# Patient Record
Sex: Male | Born: 1985 | Race: White | Hispanic: No | Marital: Single | Smoking: Current every day smoker
Health system: Southern US, Community
[De-identification: ages and names within clinical notes are randomized; demographics above are authoritative.]

## PROBLEM LIST (undated history)

## (undated) DIAGNOSIS — Z87442 Personal history of urinary calculi: Secondary | ICD-10-CM

## (undated) DIAGNOSIS — F32A Depression, unspecified: Secondary | ICD-10-CM

## (undated) DIAGNOSIS — F319 Bipolar disorder, unspecified: Secondary | ICD-10-CM

## (undated) DIAGNOSIS — E079 Disorder of thyroid, unspecified: Secondary | ICD-10-CM

## (undated) DIAGNOSIS — R011 Cardiac murmur, unspecified: Secondary | ICD-10-CM

## (undated) DIAGNOSIS — R3915 Urgency of urination: Secondary | ICD-10-CM

## (undated) HISTORY — PX: WISDOM TOOTH EXTRACTION: SHX21

---
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
## 2006-11-03 ENCOUNTER — Emergency Department: Payer: Self-pay | Admitting: Unknown Physician Specialty

## 2006-11-03 ENCOUNTER — Other Ambulatory Visit: Payer: Self-pay

## 2006-11-04 ENCOUNTER — Emergency Department: Payer: Self-pay | Admitting: Emergency Medicine

## 2007-02-04 ENCOUNTER — Other Ambulatory Visit: Payer: Self-pay

## 2007-02-04 ENCOUNTER — Emergency Department: Payer: Self-pay | Admitting: Emergency Medicine

## 2009-09-28 ENCOUNTER — Emergency Department: Payer: Self-pay | Admitting: Emergency Medicine

## 2009-10-17 ENCOUNTER — Emergency Department: Payer: Self-pay | Admitting: Emergency Medicine

## 2009-10-29 ENCOUNTER — Emergency Department: Payer: Self-pay | Admitting: Emergency Medicine

## 2009-12-13 ENCOUNTER — Emergency Department: Payer: Self-pay | Admitting: Emergency Medicine

## 2010-01-22 ENCOUNTER — Emergency Department: Payer: Self-pay | Admitting: Emergency Medicine

## 2010-01-31 ENCOUNTER — Emergency Department: Payer: Self-pay | Admitting: Emergency Medicine

## 2010-03-17 ENCOUNTER — Emergency Department: Payer: Self-pay | Admitting: Emergency Medicine

## 2010-06-27 ENCOUNTER — Emergency Department (HOSPITAL_COMMUNITY)
Admission: EM | Admit: 2010-06-27 | Discharge: 2010-06-27 | Disposition: A | Discharge status: Home / Self Care | Payer: Self-pay | Attending: Emergency Medicine | Admitting: Emergency Medicine

## 2010-06-27 DIAGNOSIS — K029 Dental caries, unspecified: Secondary | ICD-10-CM | POA: Insufficient documentation

## 2010-06-27 DIAGNOSIS — K089 Disorder of teeth and supporting structures, unspecified: Secondary | ICD-10-CM | POA: Insufficient documentation

## 2010-06-27 DIAGNOSIS — F319 Bipolar disorder, unspecified: Secondary | ICD-10-CM | POA: Insufficient documentation

## 2010-08-30 ENCOUNTER — Emergency Department: Payer: Self-pay | Admitting: Emergency Medicine

## 2010-11-03 ENCOUNTER — Emergency Department: Payer: Self-pay | Admitting: Emergency Medicine

## 2012-01-02 ENCOUNTER — Emergency Department: Payer: Self-pay | Admitting: Internal Medicine

## 2012-06-11 ENCOUNTER — Emergency Department: Payer: Self-pay | Admitting: Emergency Medicine

## 2012-10-12 ENCOUNTER — Emergency Department: Payer: Self-pay | Admitting: Emergency Medicine

## 2013-05-01 ENCOUNTER — Emergency Department: Payer: Self-pay | Admitting: Emergency Medicine

## 2013-06-05 ENCOUNTER — Emergency Department: Payer: Self-pay | Admitting: Emergency Medicine

## 2013-08-07 ENCOUNTER — Emergency Department: Payer: Self-pay | Admitting: Emergency Medicine

## 2013-08-07 LAB — CBC WITH DIFFERENTIAL/PLATELET
BASOS PCT: 0.5 %
Basophil #: 0 10*3/uL (ref 0.0–0.1)
EOS PCT: 1.1 %
Eosinophil #: 0.1 10*3/uL (ref 0.0–0.7)
HCT: 46 % (ref 40.0–52.0)
HGB: 15.6 g/dL (ref 13.0–18.0)
Lymphocyte #: 0.3 10*3/uL — ABNORMAL LOW (ref 1.0–3.6)
Lymphocyte %: 3.2 %
MCH: 35.1 pg — AB (ref 26.0–34.0)
MCHC: 34 g/dL (ref 32.0–36.0)
MCV: 103 fL — ABNORMAL HIGH (ref 80–100)
MONOS PCT: 11.5 %
Monocyte #: 0.9 x10 3/mm (ref 0.2–1.0)
NEUTROS ABS: 6.7 10*3/uL — AB (ref 1.4–6.5)
Neutrophil %: 83.7 %
Platelet: 224 10*3/uL (ref 150–440)
RBC: 4.46 10*6/uL (ref 4.40–5.90)
RDW: 12.6 % (ref 11.5–14.5)
WBC: 8 10*3/uL (ref 3.8–10.6)

## 2013-08-07 LAB — COMPREHENSIVE METABOLIC PANEL
ALBUMIN: 3.8 g/dL (ref 3.4–5.0)
Alkaline Phosphatase: 95 U/L
Anion Gap: 6 — ABNORMAL LOW (ref 7–16)
BILIRUBIN TOTAL: 0.7 mg/dL (ref 0.2–1.0)
BUN: 9 mg/dL (ref 7–18)
CHLORIDE: 103 mmol/L (ref 98–107)
CREATININE: 0.91 mg/dL (ref 0.60–1.30)
Calcium, Total: 8.8 mg/dL (ref 8.5–10.1)
Co2: 26 mmol/L (ref 21–32)
EGFR (African American): >60
EGFR (Non-African Amer.): >60
GLUCOSE: 102 mg/dL — AB (ref 65–99)
Osmolality: 269 (ref 275–301)
POTASSIUM: 3.6 mmol/L (ref 3.5–5.1)
SGOT(AST): 28 U/L (ref 15–37)
SGPT (ALT): 41 U/L (ref 12–78)
Sodium: 135 mmol/L — ABNORMAL LOW (ref 136–145)
Total Protein: 7.6 g/dL (ref 6.4–8.2)

## 2013-08-07 LAB — URINALYSIS, COMPLETE
BACTERIA: NONE SEEN
BLOOD: NEGATIVE
Bilirubin,UR: NEGATIVE
GLUCOSE, UR: NEGATIVE mg/dL (ref 0–75)
Ketone: NEGATIVE
Leukocyte Esterase: NEGATIVE
Nitrite: NEGATIVE
PH: 7 (ref 4.5–8.0)
Protein: NEGATIVE
RBC,UR: <1 /HPF (ref 0–5)
SPECIFIC GRAVITY: 1.016 (ref 1.003–1.030)
SQUAMOUS EPITHELIAL: NONE SEEN
WBC UR: 2 /HPF (ref 0–5)

## 2013-08-07 LAB — RAPID INFLUENZA A&B ANTIGENS

## 2013-08-07 LAB — LIPASE, BLOOD: Lipase: 71 U/L — ABNORMAL LOW (ref 73–393)

## 2013-08-07 LAB — TROPONIN I

## 2013-08-08 LAB — MONONUCLEOSIS SCREEN: MONO TEST: NEGATIVE

## 2014-10-24 ENCOUNTER — Encounter: Payer: Self-pay | Admitting: Urgent Care

## 2014-10-24 ENCOUNTER — Emergency Department
Admission: EM | Admit: 2014-10-24 | Discharge: 2014-10-24 | Disposition: A | Discharge status: Home / Self Care | Payer: Medicaid Other | Attending: Emergency Medicine | Admitting: Emergency Medicine

## 2014-10-24 DIAGNOSIS — A6920 Lyme disease, unspecified: Secondary | ICD-10-CM | POA: Insufficient documentation

## 2014-10-24 DIAGNOSIS — R109 Unspecified abdominal pain: Secondary | ICD-10-CM | POA: Insufficient documentation

## 2014-10-24 DIAGNOSIS — R51 Headache: Secondary | ICD-10-CM | POA: Diagnosis not present

## 2014-10-24 DIAGNOSIS — W57XXXA Bitten or stung by nonvenomous insect and other nonvenomous arthropods, initial encounter: Secondary | ICD-10-CM | POA: Insufficient documentation

## 2014-10-24 DIAGNOSIS — Y998 Other external cause status: Secondary | ICD-10-CM | POA: Insufficient documentation

## 2014-10-24 DIAGNOSIS — Y9389 Activity, other specified: Secondary | ICD-10-CM | POA: Diagnosis not present

## 2014-10-24 DIAGNOSIS — Z720 Tobacco use: Secondary | ICD-10-CM | POA: Diagnosis not present

## 2014-10-24 DIAGNOSIS — S60562A Insect bite (nonvenomous) of left hand, initial encounter: Secondary | ICD-10-CM | POA: Diagnosis present

## 2014-10-24 DIAGNOSIS — Y9289 Other specified places as the place of occurrence of the external cause: Secondary | ICD-10-CM | POA: Insufficient documentation

## 2014-10-24 HISTORY — DX: Cardiac murmur, unspecified: R01.1

## 2014-10-24 HISTORY — DX: Disorder of thyroid, unspecified: E07.9

## 2014-10-24 MED ORDER — DOXYCYCLINE HYCLATE 100 MG PO TABS: 100.0000 mg | ORAL_TABLET | Freq: Two times a day (BID) | ORAL | Status: DC

## 2014-10-24 MED ORDER — ACETAMINOPHEN 325 MG PO TABS
650.0000 mg | ORAL_TABLET | Freq: Once | ORAL | Status: AC
  Administered 2014-10-24: 650 mg via ORAL

## 2014-10-24 MED ORDER — ACETAMINOPHEN 325 MG PO TABS
ORAL_TABLET | ORAL | Status: AC
  Administered 2014-10-24: 650 mg via ORAL
  Filled 2014-10-24: qty 2

## 2014-10-24 MED ORDER — DOXYCYCLINE HYCLATE 100 MG PO TABS
100.0000 mg | ORAL_TABLET | Freq: Once | ORAL | Status: AC
  Administered 2014-10-24: 100 mg via ORAL

## 2014-10-24 MED ORDER — DOXYCYCLINE HYCLATE 100 MG PO TABS
ORAL_TABLET | ORAL | Status: AC
  Administered 2014-10-24: 100 mg via ORAL
  Filled 2014-10-24: qty 1

## 2014-10-24 NOTE — ED Provider Notes (Signed)
[facility] Emergency Department Provider Note  ____________________________________________  Time seen: 2:30 AM  I have reviewed the triage vital signs and the nursing notes.   HISTORY  Chief Complaint Headache; Abdominal Pain; and Insect Bite      HPI Brian Cole is a 29 y.o. male into generalize myalgia and malaise subjective fevers times one week. Patient stated he had a tick bite on his left hand apparently a week ago with symptoms onset following that event.     Past Medical History  Diagnosis Date  . Murmur   . Thyroid disease     There are no active problems to display for this patient.   History reviewed. No pertinent past surgical history.  Current Outpatient Rx  Name  Route  Sig  Dispense  Refill  . doxycycline (VIBRA-TABS) 100 MG tablet   Oral   Take 1 tablet (100 mg total) by mouth 2 (two) times daily.   28 tablet   0     Allergies Review of patient's allergies indicates no known allergies.  No family history on file.  Social History History  Substance Use Topics  . Smoking status: Current Every Day Smoker  . Smokeless tobacco: Not on file  . Alcohol Use: Yes    Review of Systems  Constitutional: Negative for fever. Eyes: Negative for visual changes. ENT: Negative for sore throat. Cardiovascular: Negative for chest pain. Respiratory: Negative for shortness of breath. Gastrointestinal: Negative for abdominal pain, vomiting and diarrhea. Genitourinary: Negative for dysuria. Musculoskeletal: Negative for back pain. Skin: Negative for rash. Neurological: Negative for headaches, focal weakness or numbness.   10-point ROS otherwise negative.  ____________________________________________   PHYSICAL EXAM:  VITAL SIGNS: ED Triage Vitals  Enc Vitals Group     BP 10/24/14 0053 136/73 mmHg     Pulse Rate 10/24/14 0053 97     Resp 10/24/14 0053 18     Temp 10/24/14 0053 99 F (37.2 C)     Temp Source  10/24/14 0053 Oral     SpO2 10/24/14 0053 97 %     Weight 10/24/14 0053 265 lb (120.203 kg)     Height 10/24/14 0053 5\' 11"  (1.803 m)     Head Cir --      Peak Flow --      Pain Score 10/24/14 0056 5     Pain Loc --      Pain Edu? --      Excl. in GC? --      Constitutional: Alert and oriented. Well appearing and in no distress. Eyes: Conjunctivae are normal. PERRL. Normal extraocular movements. ENT   Head: Normocephalic and atraumatic.   Nose: No congestion/rhinnorhea.   Mouth/Throat: Mucous membranes are moist.   Neck: No stridor. Hematological/Lymphatic/Immunilogical: No cervical lymphadenopathy. Cardiovascular: Normal rate, regular rhythm. Normal and symmetric distal pulses are present in all extremities. No murmurs, rubs, or gallops. Respiratory: Normal respiratory effort without tachypnea nor retractions. Breath sounds are clear and equal bilaterally. No wheezes/rales/rhonchi. Gastrointestinal: Soft and nontender. No distention. There is no CVA tenderness. Genitourinary: deferred Musculoskeletal: Nontender with normal range of motion in all extremities. No joint effusions.  No lower extremity tenderness nor edema. Neurologic:  Normal speech and language. No gross focal neurologic deficits are appreciated. Speech is normal.  Skin:  Skin is warm, dry and intact. No rash noted. Psychiatric: Mood and affect are normal. Speech and behavior are normal. Patient exhibits appropriate insight and judgment.  ____________________________________________  INITIAL IMPRESSION / ASSESSMENT AND PLAN / ED COURSE  Pertinent labs & imaging results that were available during my care of the patient were reviewed by me and considered in my medical decision making (see chart for details).  Physical exam concerning for Lyme disease as such patient given doxycycline 100 mg in the emergency department will be prescribed the same at  home.  ____________________________________________   FINAL CLINICAL IMPRESSION(S) / ED DIAGNOSES  Final diagnoses:  Lyme disease      Darci Current, MD 10/24/14 [phone]

## 2014-10-24 NOTE — ED Notes (Signed)
Spoke with Manson Passey, MD regarding presenting c/o and triage assessment. MD does not want labs at this time; no orders per MD.

## 2014-10-24 NOTE — ED Notes (Signed)
Patient presents with c/o a tick removal over a week ago. Patient advising that he has had a headache, abd pain, and trouble getting bowels to move. Patient states, "I am too weak to push it out."

## 2014-10-24 NOTE — Discharge Instructions (Signed)
Lyme Disease You may have been bitten by a tick and are to watch for the development of Lyme Disease. Lyme Disease is an infection that is caused by a bacteria The bacteria causing this disease is named Borreilia burgdorferi. If a tick is infected with this bacteria and then bites you, then Lyme Disease may occur. These ticks are carried by deer and rodents such as rabbits and mice and infest grassy as well as forested areas. Fortunately most tick bites do not cause Lyme Disease.  Lyme Disease is easier to prevent than to treat. First, covering your legs with clothing when walking in areas where ticks are possibly abundant will prevent their attachment because ticks tend to stay within inches of the ground. Second, using insecticides containing DEET can be applied on skin or clothing. Last, because it takes about 12 to 24 hours for the tick to transmit the disease after attachment to the human host, you should inspect your body for ticks twice a day when you are in areas where Lyme Disease is common. You must look thoroughly when searching for ticks. The Ixodes tick that carries Lyme Disease is very small. It is around the size of a sesame seed (picture of tick is not actual size). Removal is best done by grasping the tick by the head and pulling it out. Do not to squeeze the body of the tick. This could inject the infecting bacteria into the bite site. Wash the area of the bite with an antiseptic solution after removal.  Lyme Disease is a disease that may affect many body systems. Because of the small size of the biting tick, most people do not notice being bitten. The first sign of an infection is usually a round red rash that extends out from the center of the tick bite. The center of the lesion may be blood colored (hemorrhagic) or have tiny blisters (vesicular). Most lesions have bright red outer borders and partial central clearing. This rash may extend out many inches in diameter, and multiple lesions may  be present. Other symptoms such as fatigue, headaches, chills and fever, general achiness and swelling of lymph glands may also occur. If this first stage of the disease is left untreated, these symptoms may gradually resolve by themselves, or progressive symptoms may occur because of spread of infection to other areas of the body.  Follow up with your caregiver to have testing and treatment if you have a tick bite and you develop any of the above complaints. Your caregiver may recommend preventative (prophylactic) medications which kill bacteria (antibiotics). Once a diagnosis of Lyme Disease is made, antibiotic treatment is highly likely to cure the disease. Effective treatment of late stage Lyme Disease may require longer courses of antibiotic therapy.  MAKE SURE YOU:   Understand these instructions.  Will watch your condition.  Will get help right away if you are not doing well or get worse. Document Released: 08/02/2000 Document Revised: 07/19/2011 Document Reviewed: 10/04/2008 Exit[employer] Patient Information 2015 [employer]. This information is not intended to replace advice given to you by your health care provider. Make sure you discuss any questions you have with your health care provider.  

## 2014-12-13 IMAGING — CR DG CHEST 2V
1 series · 2 of 2 positions shown · non-contrast
Comparison: October 29, 2009

CLINICAL DATA: Productive cough

EXAM:
CHEST  2 VIEW

[Series 1: pa · 0.17mm/px · 2 of 2 slices shown]
[im 1/2]
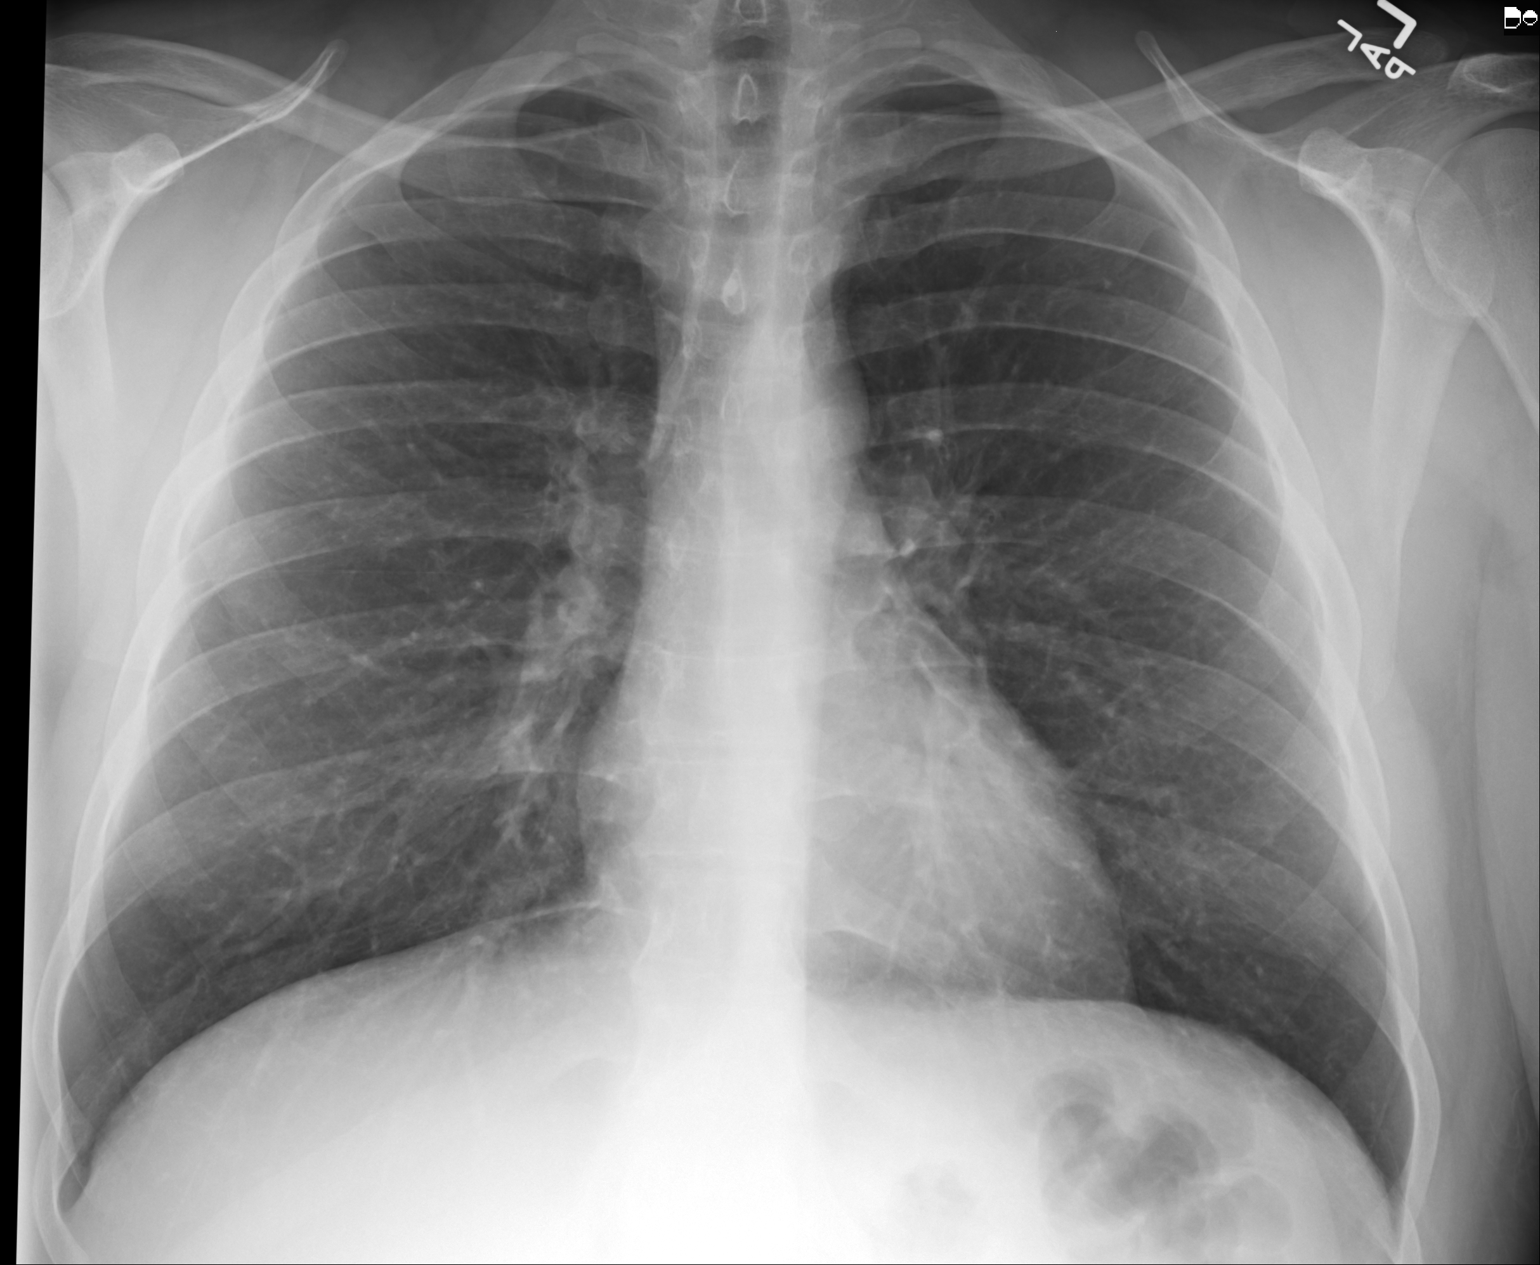
[im 2/2]
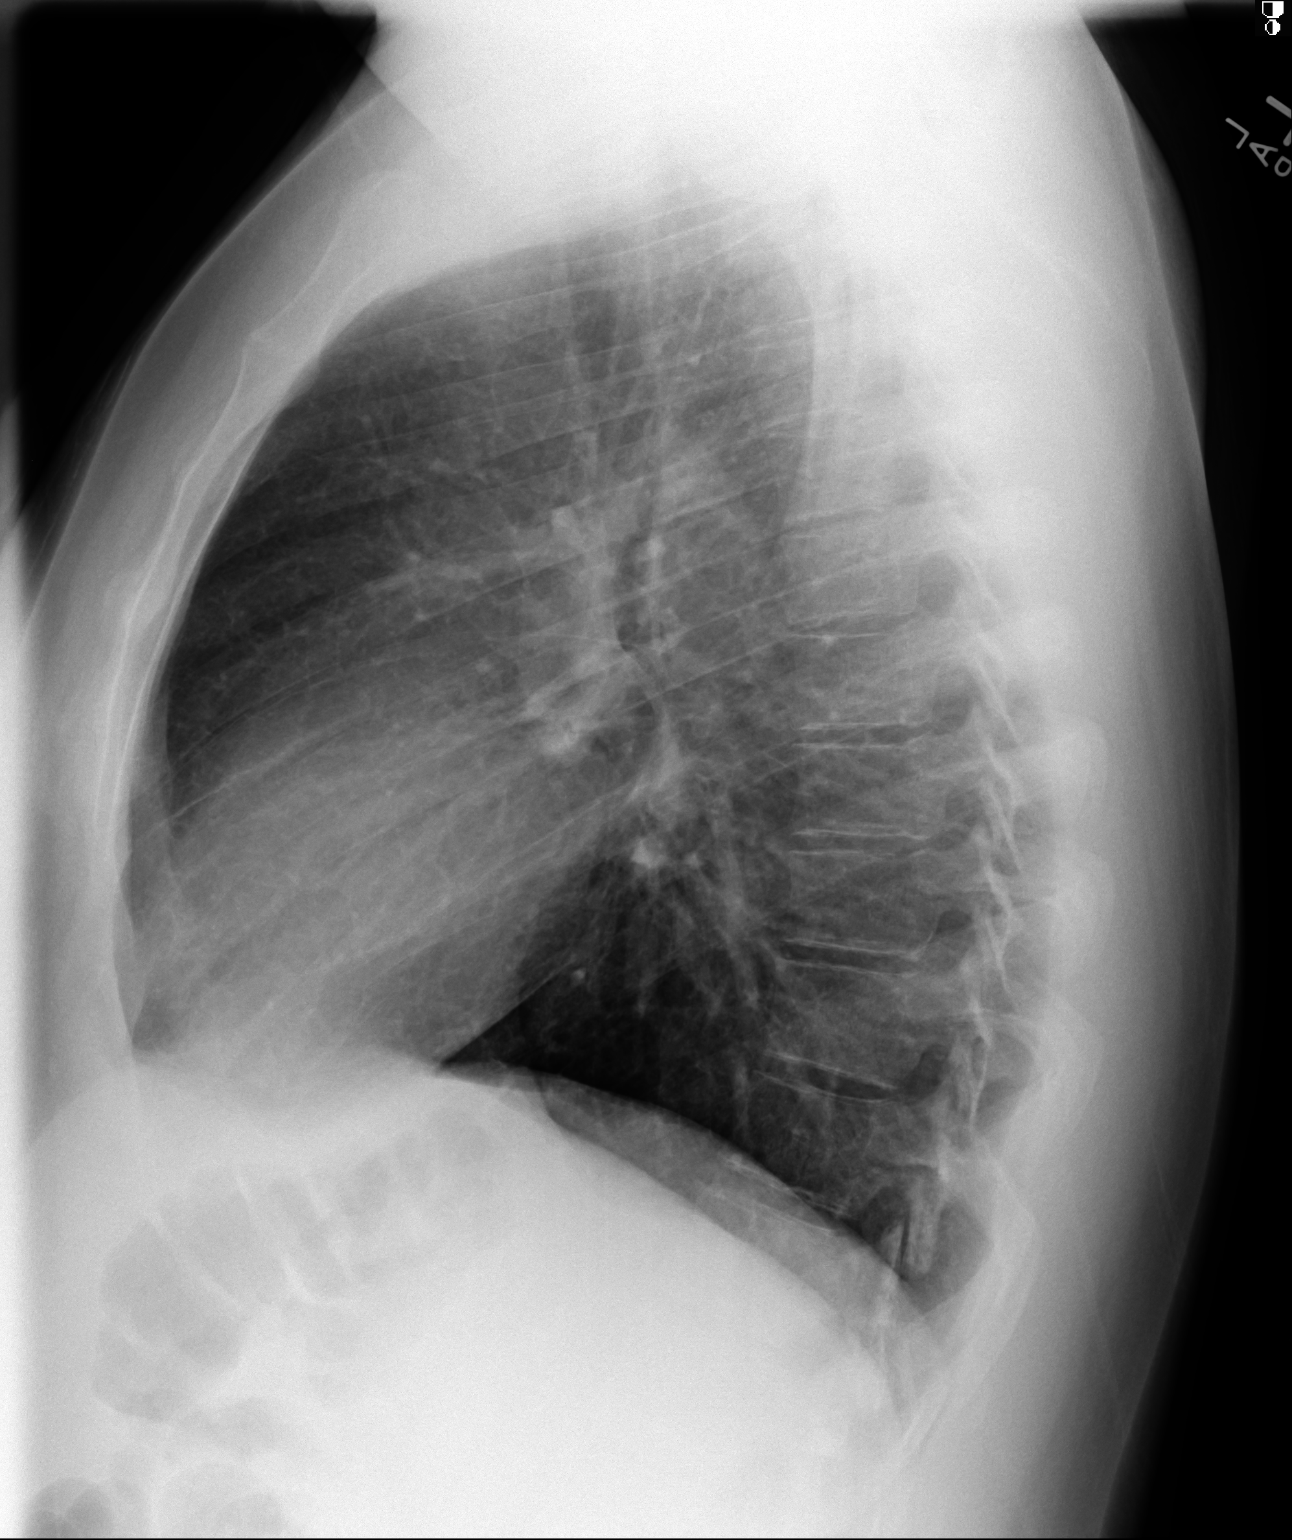

[2 of 2 positions shown; findings below may reference images not displayed]

FINDINGS: The lungs are clear. Heart size and pulmonary vascularity are
normal. No adenopathy. No bone lesions.
IMPRESSION: No abnormality noted.

## 2015-03-21 IMAGING — CR DG CHEST 2V
1 series · 2 of 2 positions shown · non-contrast
Comparison: Prior chest x-ray 06/05/2013

CLINICAL DATA: Fever, body aches, nausea and vomiting

EXAM:
CHEST  2 VIEW

[Series 1: w chest pa · 0.14mm/px · 2 of 2 slices shown]
[im 1/2]
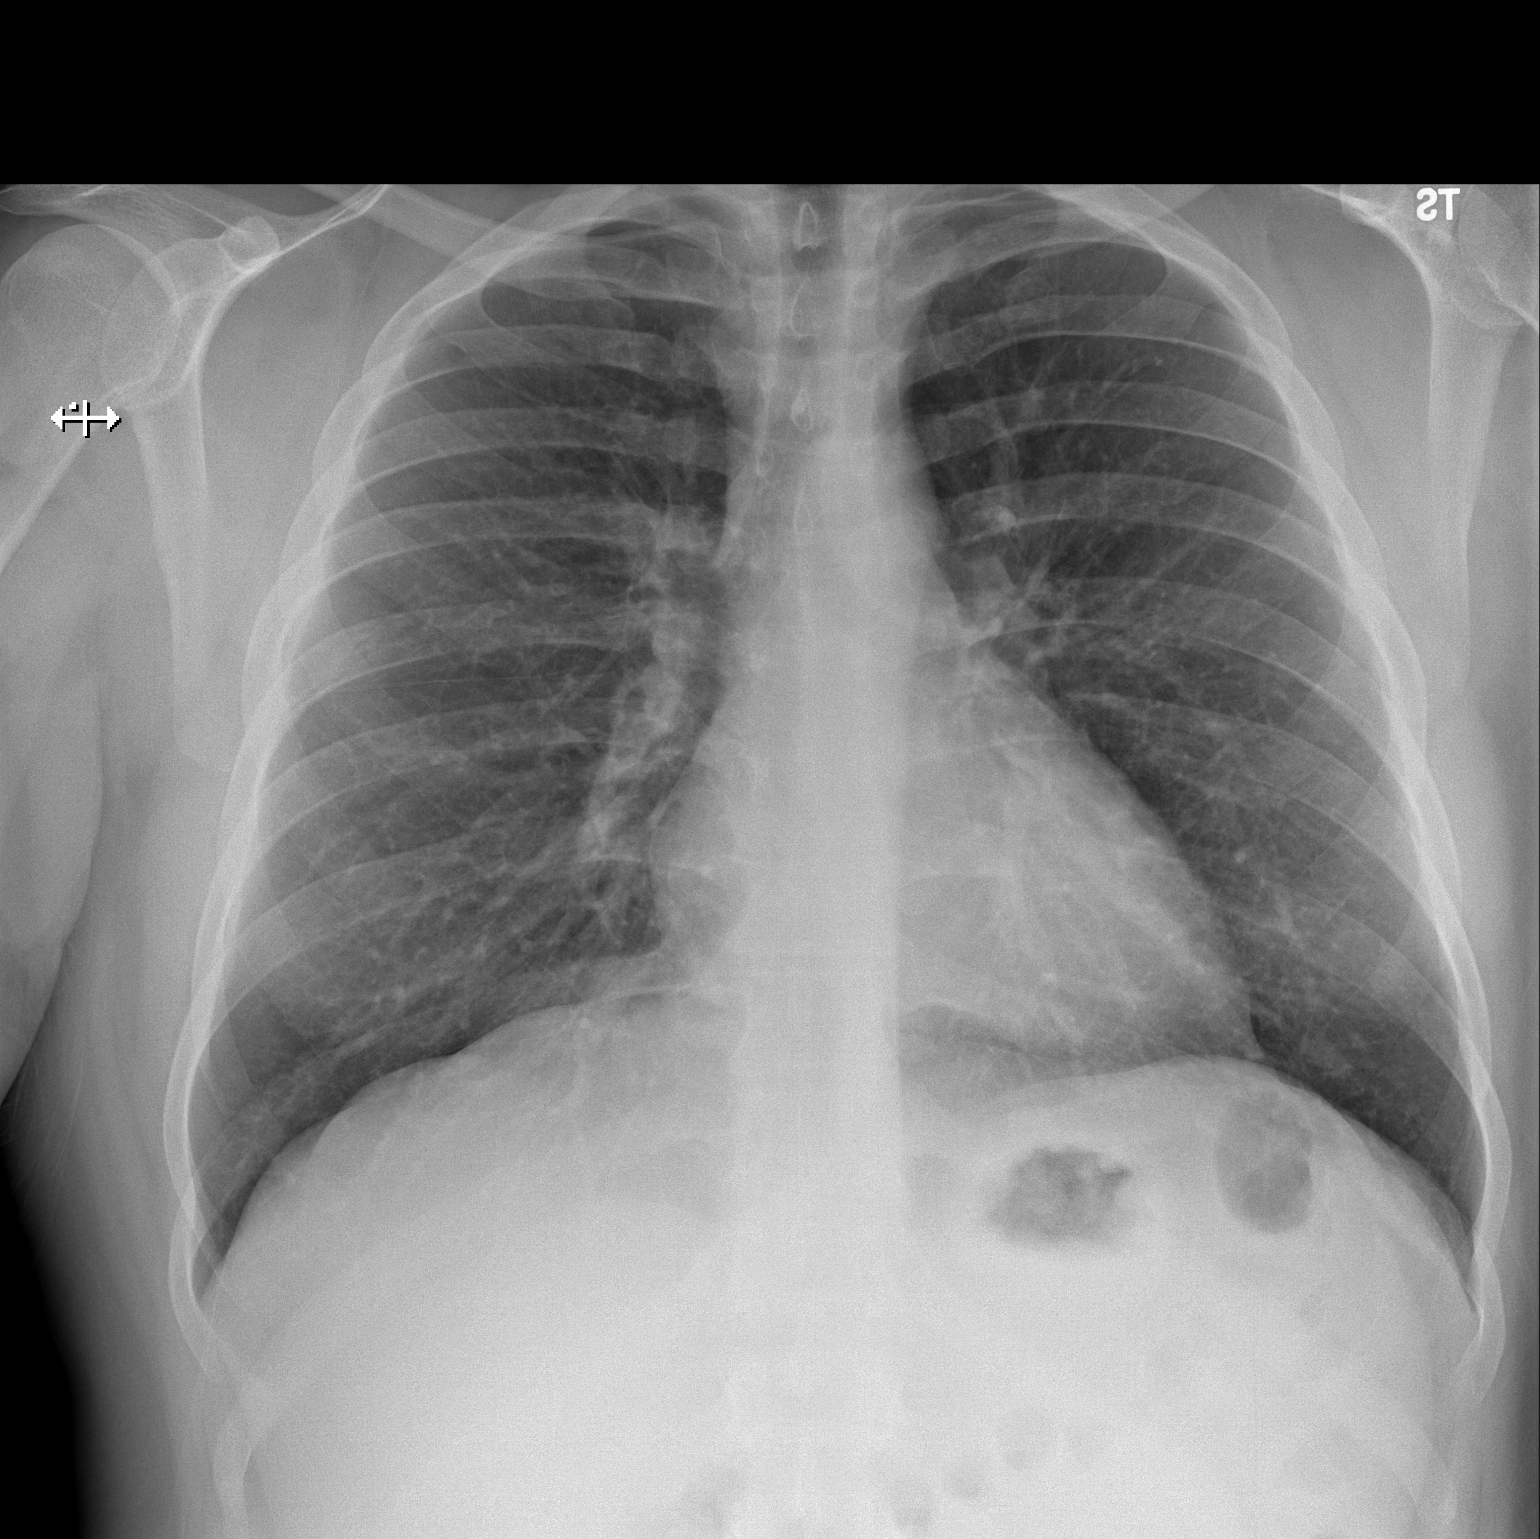
[im 2/2]
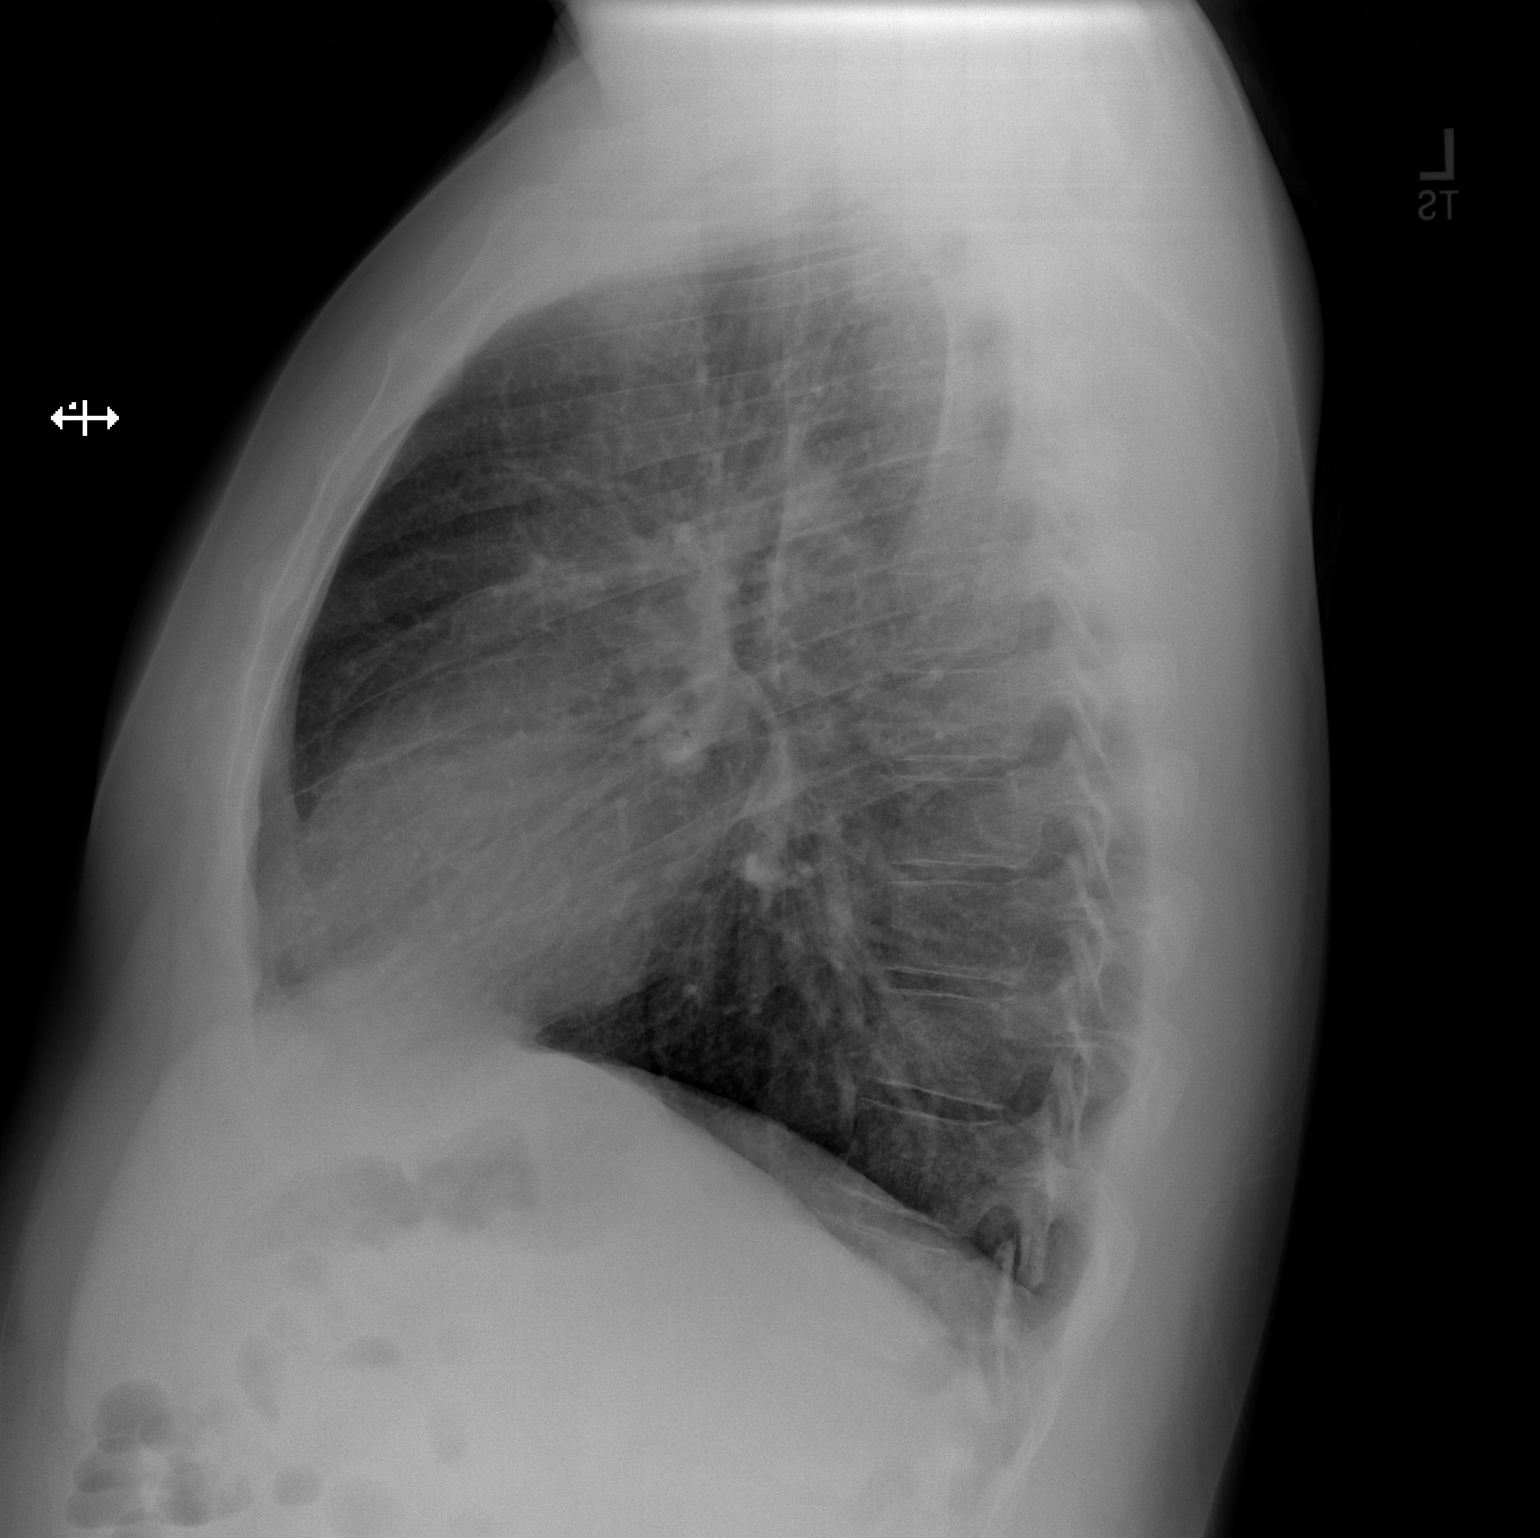

[2 of 2 positions shown; findings below may reference images not displayed]

FINDINGS: The lungs are clear and negative for focal airspace consolidation,
pulmonary edema or suspicious pulmonary nodule. No pleural effusion
or pneumothorax. Cardiac and mediastinal contours are within normal
limits. No acute fracture or lytic or blastic osseous lesions. The
visualized upper abdominal bowel gas pattern is unremarkable.
IMPRESSION: No active cardiopulmonary disease.

## 2016-11-13 ENCOUNTER — Emergency Department: Payer: No Typology Code available for payment source

## 2016-11-13 ENCOUNTER — Emergency Department
Admission: EM | Admit: 2016-11-13 | Discharge: 2016-11-13 | Disposition: A | Discharge status: Home / Self Care | Payer: No Typology Code available for payment source | Attending: Emergency Medicine | Admitting: Emergency Medicine

## 2016-11-13 ENCOUNTER — Encounter: Payer: Self-pay | Admitting: Emergency Medicine

## 2016-11-13 DIAGNOSIS — Y939 Activity, unspecified: Secondary | ICD-10-CM | POA: Diagnosis not present

## 2016-11-13 DIAGNOSIS — F17200 Nicotine dependence, unspecified, uncomplicated: Secondary | ICD-10-CM | POA: Insufficient documentation

## 2016-11-13 DIAGNOSIS — Y92410 Unspecified street and highway as the place of occurrence of the external cause: Secondary | ICD-10-CM | POA: Diagnosis not present

## 2016-11-13 DIAGNOSIS — S40011A Contusion of right shoulder, initial encounter: Secondary | ICD-10-CM | POA: Diagnosis not present

## 2016-11-13 DIAGNOSIS — S199XXA Unspecified injury of neck, initial encounter: Secondary | ICD-10-CM | POA: Diagnosis present

## 2016-11-13 DIAGNOSIS — Y999 Unspecified external cause status: Secondary | ICD-10-CM | POA: Insufficient documentation

## 2016-11-13 DIAGNOSIS — S161XXA Strain of muscle, fascia and tendon at neck level, initial encounter: Secondary | ICD-10-CM

## 2016-11-13 MED ORDER — METHOCARBAMOL 500 MG PO TABS
500.0000 mg | ORAL_TABLET | Freq: Once | ORAL | Status: AC
  Administered 2016-11-13: 500 mg via ORAL
  Filled 2016-11-13: qty 1

## 2016-11-13 MED ORDER — MELOXICAM 7.5 MG PO TABS
15.0000 mg | ORAL_TABLET | Freq: Once | ORAL | Status: AC
  Administered 2016-11-13: 15 mg via ORAL
  Filled 2016-11-13: qty 2

## 2016-11-13 MED ORDER — MELOXICAM 15 MG PO TABS: 15.0000 mg | ORAL_TABLET | Freq: Every day | ORAL | 0 refills | Status: DC

## 2016-11-13 MED ORDER — METHOCARBAMOL 500 MG PO TABS: 500.0000 mg | ORAL_TABLET | Freq: Four times a day (QID) | ORAL | 0 refills | Status: DC

## 2016-11-13 NOTE — ED Notes (Signed)
Patient transported to X-ray 
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
## 2017-10-25 ENCOUNTER — Encounter: Payer: Self-pay | Admitting: Emergency Medicine

## 2017-10-25 ENCOUNTER — Emergency Department
Admission: EM | Admit: 2017-10-25 | Discharge: 2017-10-26 | Disposition: A | Discharge status: Home / Self Care | Payer: Medicaid Other | Attending: Emergency Medicine | Admitting: Emergency Medicine

## 2017-10-25 DIAGNOSIS — R1031 Right lower quadrant pain: Secondary | ICD-10-CM | POA: Diagnosis present

## 2017-10-25 DIAGNOSIS — Z5321 Procedure and treatment not carried out due to patient leaving prior to being seen by health care provider: Secondary | ICD-10-CM | POA: Insufficient documentation

## 2017-10-25 LAB — URINALYSIS, COMPLETE (UACMP) WITH MICROSCOPIC
Bacteria, UA: NONE SEEN
Bilirubin Urine: NEGATIVE
Glucose, UA: NEGATIVE mg/dL
HGB URINE DIPSTICK: NEGATIVE
Ketones, ur: NEGATIVE mg/dL
Leukocytes, UA: NEGATIVE
Nitrite: NEGATIVE
PROTEIN: NEGATIVE mg/dL
Specific Gravity, Urine: 1.009 (ref 1.005–1.030)
Squamous Epithelial / LPF: NONE SEEN (ref 0–5)
WBC UA: NONE SEEN WBC/hpf (ref 0–5)
pH: 6 (ref 5.0–8.0)

## 2017-10-25 LAB — COMPREHENSIVE METABOLIC PANEL
ALK PHOS: 85 U/L (ref 38–126)
ALT: 27 U/L (ref 17–63)
ANION GAP: 11 (ref 5–15)
AST: 24 U/L (ref 15–41)
Albumin: 4.2 g/dL (ref 3.5–5.0)
BILIRUBIN TOTAL: 0.4 mg/dL (ref 0.3–1.2)
BUN: 10 mg/dL (ref 6–20)
CALCIUM: 8.8 mg/dL — AB (ref 8.9–10.3)
CO2: 23 mmol/L (ref 22–32)
Chloride: 104 mmol/L (ref 101–111)
Creatinine, Ser: 0.94 mg/dL (ref 0.61–1.24)
GFR calc non Af Amer: >60 mL/min (ref >60)
Glucose, Bld: 140 mg/dL — ABNORMAL HIGH (ref 65–99)
POTASSIUM: 3.4 mmol/L — AB (ref 3.5–5.1)
Sodium: 138 mmol/L (ref 135–145)
Total Protein: 7.4 g/dL (ref 6.5–8.1)

## 2017-10-25 LAB — CBC
HCT: 42.2 % (ref 40.0–52.0)
HEMOGLOBIN: 14.9 g/dL (ref 13.0–18.0)
MCH: 36.3 pg — ABNORMAL HIGH (ref 26.0–34.0)
MCHC: 35.2 g/dL (ref 32.0–36.0)
MCV: 103.2 fL — ABNORMAL HIGH (ref 80.0–100.0)
Platelets: 312 10*3/uL (ref 150–440)
RBC: 4.09 MIL/uL — ABNORMAL LOW (ref 4.40–5.90)
RDW: 12.7 % (ref 11.5–14.5)
WBC: 12.4 10*3/uL — ABNORMAL HIGH (ref 3.8–10.6)

## 2017-10-25 LAB — LIPASE, BLOOD: Lipase: 35 U/L (ref 11–51)

## 2017-10-25 NOTE — ED Notes (Signed)
Pt not in lobby.  

## 2017-10-25 NOTE — ED Triage Notes (Signed)
Pt ambulatory to triage with steady gait, no distress noted. Pt c/o lower right abdominal pain x2 days. Pt denies N/V/D.
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
## 2018-06-28 ENCOUNTER — Encounter: Payer: Self-pay | Admitting: Emergency Medicine

## 2018-06-28 ENCOUNTER — Other Ambulatory Visit: Payer: Self-pay

## 2018-06-28 DIAGNOSIS — K644 Residual hemorrhoidal skin tags: Secondary | ICD-10-CM | POA: Diagnosis not present

## 2018-06-28 DIAGNOSIS — K625 Hemorrhage of anus and rectum: Secondary | ICD-10-CM | POA: Diagnosis present

## 2018-06-28 NOTE — ED Triage Notes (Addendum)
Patient ambulatory to triage with steady gait, without difficulty or distress noted; pt reports rectal pain & bleeding since this evening; denies any abd pain; denies hx of same; st unknown if he has hemorrhoid, as "he can't see back there"

## 2018-06-29 ENCOUNTER — Emergency Department
Admission: EM | Admit: 2018-06-29 | Discharge: 2018-06-29 | Disposition: A | Discharge status: Home / Self Care | Payer: Medicaid Other | Attending: Emergency Medicine | Admitting: Emergency Medicine

## 2018-06-29 DIAGNOSIS — K644 Residual hemorrhoidal skin tags: Secondary | ICD-10-CM

## 2018-06-29 LAB — COMPREHENSIVE METABOLIC PANEL
ALT: 22 U/L (ref 0–44)
AST: 21 U/L (ref 15–41)
Albumin: 4.3 g/dL (ref 3.5–5.0)
Alkaline Phosphatase: 89 U/L (ref 38–126)
Anion gap: 10 (ref 5–15)
BUN: 12 mg/dL (ref 6–20)
CO2: 24 mmol/L (ref 22–32)
CREATININE: 0.93 mg/dL (ref 0.61–1.24)
Calcium: 9.1 mg/dL (ref 8.9–10.3)
Chloride: 101 mmol/L (ref 98–111)
GFR calc Af Amer: >60 mL/min (ref >60)
GFR calc non Af Amer: >60 mL/min (ref >60)
Glucose, Bld: 123 mg/dL — ABNORMAL HIGH (ref 70–99)
POTASSIUM: 3.6 mmol/L (ref 3.5–5.1)
SODIUM: 135 mmol/L (ref 135–145)
Total Bilirubin: 0.4 mg/dL (ref 0.3–1.2)
Total Protein: 8 g/dL (ref 6.5–8.1)

## 2018-06-29 LAB — CBC WITH DIFFERENTIAL/PLATELET
Abs Immature Granulocytes: 0.06 10*3/uL (ref 0.00–0.07)
Basophils Absolute: 0.1 10*3/uL (ref 0.0–0.1)
Basophils Relative: 0 %
Eosinophils Absolute: 0.3 10*3/uL (ref 0.0–0.5)
Eosinophils Relative: 2 %
HCT: 44.7 % (ref 39.0–52.0)
Hemoglobin: 15.5 g/dL (ref 13.0–17.0)
IMMATURE GRANULOCYTES: 0 %
Lymphocytes Relative: 21 %
Lymphs Abs: 3.1 10*3/uL (ref 0.7–4.0)
MCH: 35.1 pg — AB (ref 26.0–34.0)
MCHC: 34.7 g/dL (ref 30.0–36.0)
MCV: 101.1 fL — AB (ref 80.0–100.0)
MONO ABS: 1.2 10*3/uL — AB (ref 0.1–1.0)
Monocytes Relative: 8 %
NEUTROS PCT: 69 %
Neutro Abs: 10.6 10*3/uL — ABNORMAL HIGH (ref 1.7–7.7)
Platelets: 350 10*3/uL (ref 150–400)
RBC: 4.42 MIL/uL (ref 4.22–5.81)
RDW: 11.8 % (ref 11.5–15.5)
WBC: 15.3 10*3/uL — ABNORMAL HIGH (ref 4.0–10.5)
nRBC: 0 % (ref 0.0–0.2)

## 2018-06-29 MED ORDER — LIDOCAINE VISCOUS HCL 2 % MT SOLN
15.0000 mL | Freq: Once | OROMUCOSAL | Status: AC
  Administered 2018-06-29: 15 mL via OROMUCOSAL
  Filled 2018-06-29: qty 15

## 2018-06-29 NOTE — ED Notes (Signed)
Sherilyn Cooter RN and Dr. Manson Passey assessed the Pt and Dr. Manson Passey determined that the Pt has a hemorrhoid.

## 2018-06-29 NOTE — ED Provider Notes (Signed)
[facility] Emergency Department Provider Note ________   First MD Initiated Contact with Patient 06/29/18 0134     (approximate)  I have reviewed the triage vital signs and the nursing notes.   HISTORY  Chief Complaint Rectal Bleeding    HPI Brian Cole is a 33 y.o. male with below list of chronic medical conditions presents to the emergency department with annulus bright red blood per rectum noted today.  Patient states that he felt moisture in his bottom and when he checked he noted blood.  Patient denies any abdominal pain no diarrhea constipation.  Patient denies any fever.   Past Medical History:  Diagnosis Date  . Murmur   . Thyroid disease     There are no active problems to display for this patient.   History reviewed. No pertinent surgical history.  Prior to Admission medications   Medication Sig Start Date End Date Taking? Authorizing Provider  doxycycline (VIBRA-TABS) 100 MG tablet Take 1 tablet (100 mg total) by mouth 2 (two) times daily. 10/24/14   Darci Current, MD  meloxicam (MOBIC) 15 MG tablet Take 1 tablet (15 mg total) by mouth daily. 11/13/16   Cuthriell, Delorise Royals, PA-C  methocarbamol (ROBAXIN) 500 MG tablet Take 1 tablet (500 mg total) by mouth 4 (four) times daily. 11/13/16   Cuthriell, Delorise Royals, PA-C    Allergies Penicillins  No family history on file.  Social History Social History   Tobacco Use  . Smoking status: Current Every Day Smoker  . Smokeless tobacco: Never Used  Substance Use Topics  . Alcohol use: Yes  . Drug use: No    Review of Systems Constitutional: No fever/chills Eyes: No visual changes. ENT: No sore throat. Cardiovascular: Denies chest pain. Respiratory: Denies shortness of breath. Gastrointestinal: No abdominal pain.  No nausea, no vomiting.  No diarrhea.  No constipation.  Positive for bright red blood per rectum Genitourinary: Negative for dysuria. Musculoskeletal: Negative  for neck pain.  Negative for back pain. Integumentary: Negative for rash. Neurological: Negative for headaches, focal weakness or numbness.   ____________________________________________   PHYSICAL EXAM:  VITAL SIGNS: ED Triage Vitals  Enc Vitals Group     BP 06/28/18 2338 127/72     Pulse Rate 06/28/18 2338 97     Resp 06/28/18 2338 18     Temp 06/28/18 2338 98.4 F (36.9 C)     Temp Source 06/28/18 2338 Oral     SpO2 06/28/18 2338 99 %     Weight 06/28/18 2325 (!) 138.3 kg (305 lb)     Height 06/28/18 2325 1.803 m (5\' 11" )     Head Circumference --      Peak Flow --      Pain Score 06/28/18 2325 8     Pain Loc --      Pain Edu? --      Excl. in GC? --    Constitutional: Alert and oriented. Well appearing and in no acute distress. Eyes: Conjunctivae are normal.  Mouth/Throat: Mucous membranes are moist. Oropharynx non-erythematous. Neck: No stridor.  Cardiovascular: Normal rate, regular rhythm. Good peripheral circulation. Grossly normal heart sounds. Respiratory: Normal respiratory effort.  No retractions. Lungs CTAB. Gastrointestinal: Soft and nontender. No distention.  Posterior midline hemorrhoid with evidence of recent bleeding. Musculoskeletal: No lower extremity tenderness nor edema. No gross deformities of extremities. Neurologic:  Normal speech and language. No gross focal neurologic deficits are appreciated.  Skin:  Skin is warm, dry  and intact. No rash noted.   ____________________________________________   LABS (all labs ordered are listed, but only abnormal results are displayed)  Labs Reviewed  CBC WITH DIFFERENTIAL/PLATELET - Abnormal; Notable for the following components:      Result Value   WBC 15.3 (*)    MCV 101.1 (*)    MCH 35.1 (*)    Neutro Abs 10.6 (*)    Monocytes Absolute 1.2 (*)    All other components within normal limits  COMPREHENSIVE METABOLIC PANEL - Abnormal; Notable for the following components:   Glucose, Bld 123 (*)    All  other components within normal limits    Procedures   ____________________________________________   INITIAL IMPRESSION / ASSESSMENT AND PLAN / ED COURSE  As part of my medical decision making, I reviewed the following data within the electronic MEDICAL RECORD NUMBER   33 year old male presenting with above-stated history and physical exam consistent with external hemorrhoid with evidence of recent bleeding.  Viscous lidocaine applied to the area.  No evidence of thrombosis noted.    ____________________________________________  FINAL CLINICAL IMPRESSION(S) / ED DIAGNOSES  Final diagnoses:  External hemorrhoid     MEDICATIONS GIVEN DURING THIS VISIT:  Medications  lidocaine (XYLOCAINE) 2 % viscous mouth solution 15 mL (has no administration in time range)     ED Discharge Orders    None       Note:  This document was prepared using Dragon voice recognition software and may include unintentional dictation errors.   Darci Current, MD 06/29/18 [phone]

## 2018-06-29 NOTE — ED Notes (Signed)
Patient states having tender spot above rectum that has been hurting the past week. Tonight started bleeding this evening. Patient states was out shopping and thought he was sweating and states was not sweat but blood.

## 2018-06-29 NOTE — ED Notes (Signed)
No active bleeding at this time. Patient redden area around rectum.

## 2020-01-10 ENCOUNTER — Emergency Department
Admission: EM | Admit: 2020-01-10 | Discharge: 2020-01-10 | Disposition: A | Discharge status: Home / Self Care | Payer: Medicaid Other | Attending: Emergency Medicine | Admitting: Emergency Medicine

## 2020-01-10 ENCOUNTER — Emergency Department: Payer: Medicaid Other

## 2020-01-10 ENCOUNTER — Other Ambulatory Visit: Payer: Self-pay

## 2020-01-10 DIAGNOSIS — W010XXA Fall on same level from slipping, tripping and stumbling without subsequent striking against object, initial encounter: Secondary | ICD-10-CM | POA: Diagnosis not present

## 2020-01-10 DIAGNOSIS — S29012A Strain of muscle and tendon of back wall of thorax, initial encounter: Secondary | ICD-10-CM | POA: Insufficient documentation

## 2020-01-10 DIAGNOSIS — Y929 Unspecified place or not applicable: Secondary | ICD-10-CM | POA: Insufficient documentation

## 2020-01-10 DIAGNOSIS — E039 Hypothyroidism, unspecified: Secondary | ICD-10-CM | POA: Diagnosis not present

## 2020-01-10 DIAGNOSIS — S39012A Strain of muscle, fascia and tendon of lower back, initial encounter: Secondary | ICD-10-CM | POA: Diagnosis not present

## 2020-01-10 DIAGNOSIS — Y999 Unspecified external cause status: Secondary | ICD-10-CM | POA: Insufficient documentation

## 2020-01-10 DIAGNOSIS — Y939 Activity, unspecified: Secondary | ICD-10-CM | POA: Diagnosis not present

## 2020-01-10 DIAGNOSIS — S3992XA Unspecified injury of lower back, initial encounter: Secondary | ICD-10-CM | POA: Diagnosis present

## 2020-01-10 DIAGNOSIS — F17200 Nicotine dependence, unspecified, uncomplicated: Secondary | ICD-10-CM | POA: Diagnosis not present

## 2020-01-10 DIAGNOSIS — M546 Pain in thoracic spine: Secondary | ICD-10-CM

## 2020-01-10 MED ORDER — KETOROLAC TROMETHAMINE 10 MG PO TABS: 10.0000 mg | ORAL_TABLET | Freq: Three times a day (TID) | ORAL | 0 refills | Status: DC

## 2020-01-10 MED ORDER — CYCLOBENZAPRINE HCL 10 MG PO TABS
10.0000 mg | ORAL_TABLET | Freq: Once | ORAL | Status: AC
  Administered 2020-01-10: 10 mg via ORAL
  Filled 2020-01-10: qty 1

## 2020-01-10 MED ORDER — CYCLOBENZAPRINE HCL 5 MG PO TABS: 5.0000 mg | ORAL_TABLET | Freq: Three times a day (TID) | ORAL | 0 refills | Status: DC | PRN

## 2020-01-10 MED ORDER — KETOROLAC TROMETHAMINE 30 MG/ML IJ SOLN
30.0000 mg | Freq: Once | INTRAMUSCULAR | Status: AC
  Administered 2020-01-10: 30 mg via INTRAMUSCULAR
  Filled 2020-01-10: qty 1

## 2020-01-10 NOTE — Discharge Instructions (Signed)
Your exam and XR are normal at this time. Take the prescription anti-inflammatory and muscle relaxant as needed. Follow-up with your provider for continued symptoms.

## 2020-01-10 NOTE — ED Triage Notes (Signed)
PT to ED via EMS form home c/o intense back pain. Has had back issues for 10 years (herniated disc), but pt states he slipped last night and pain has been unbearable since. Reports episode of numbness to right leg and not being able to stand up this morning after sitting down.

## 2020-01-10 NOTE — ED Provider Notes (Signed)
[facility] Emergency Department Provider Note ____________________________________________  Time seen: 1838  I have reviewed the triage vital signs and the nursing notes.  HISTORY  Chief Complaint  Back Pain  HPI Brian Cole is a 34 y.o. male presents himself to the ED via EMS from home, for evaluation of acute back pain and strain.  Patient describes a slip and near fall in the kitchen yesterday.  He caught himself awkwardly on the counters.  Since that time is had increasing and intermittent pain to the mid and lower back.  Patient denies any bladder or bowel incontinence, foot drop, or saddle anesthesia.  He presents today after he was unable to rise from a seated position due to back pain and spasm.  He denies any chest pain, shortness of breath, distal paresthesias, or weakness   Past Medical History:  Diagnosis Date  . Murmur   . Thyroid disease     There are no problems to display for this patient.   History reviewed. No pertinent surgical history.  Prior to Admission medications   Medication Sig Start Date End Date Taking? Authorizing Provider  cyclobenzaprine (FLEXERIL) 5 MG tablet Take 1 tablet (5 mg total) by mouth 3 (three) times daily as needed. 01/10/20   Kalin Amrhein, Charlesetta Ivory, PA-C  ketorolac (TORADOL) 10 MG tablet Take 1 tablet (10 mg total) by mouth every 8 (eight) hours. 01/10/20   Kingstyn Deruiter, Charlesetta Ivory, PA-C    Allergies Penicillins  History reviewed. No pertinent family history.  Social History Social History   Tobacco Use  . Smoking status: Current Every Day Smoker  . Smokeless tobacco: Never Used  Vaping Use  . Vaping Use: Never used  Substance Use Topics  . Alcohol use: Not Currently  . Drug use: Yes    Types: Marijuana    Review of Systems  Constitutional: Negative for fever. Cardiovascular: Negative for chest pain. Respiratory: Negative for shortness of breath. Gastrointestinal: Negative for abdominal pain,  vomiting and diarrhea. Genitourinary: Negative for dysuria. Musculoskeletal: Positive for back pain. Skin: Negative for rash. Neurological: Negative for headaches, focal weakness or numbness. ____________________________________________  PHYSICAL EXAM:  VITAL SIGNS: ED Triage Vitals  Enc Vitals Group     BP 01/10/20 1744 126/79     Pulse Rate 01/10/20 1744 83     Resp 01/10/20 1744 20     Temp 01/10/20 1744 98.5 F (36.9 C)     Temp Source 01/10/20 1744 Oral     SpO2 01/10/20 1744 98 %     Weight 01/10/20 1746 (!) 307 lb (139.3 kg)     Height 01/10/20 1746 5\' 11"  (1.803 m)     Head Circumference --      Peak Flow --      Pain Score 01/10/20 1746 10     Pain Loc --      Pain Edu? --      Excl. in GC? --     Constitutional: Alert and oriented. Well appearing and in no distress. Head: Normocephalic and atraumatic. Eyes: Conjunctivae are normal. Normal extraocular movements Neck: Supple. No thyromegaly. Cardiovascular: Normal rate, regular rhythm. Normal distal pulses. Respiratory: Normal respiratory effort. No wheezes/rales/rhonchi. Gastrointestinal: Soft and nontender. No distention. Musculoskeletal: Normal spinal alignment.  No midline tenderness is appreciated.  Mildly tender to the paraspinal musculatures of the thoracolumbar region.  Nontender with normal range of motion in all extremities.  Neurologic: Radial nerves II through XII grossly intact.  Normal LE DTRs bilaterally.  Normal gait without ataxia. Normal speech and language. No gross focal neurologic deficits are appreciated. Skin:  Skin is warm, dry and intact. No rash noted. ____________________________________________   RADIOLOGY  DG Lumbar Spine IMPRESSION: Negative. ____________________________________________  PROCEDURES  Toradol 30 mg IM Flexeril 10 mg PO  Procedures ____________________________________________  INITIAL IMPRESSION / ASSESSMENT AND PLAN / ED COURSE  DDX: compression fracture,  lumbar strain, muscle spasms  Patient with ED evaluation of acute thoracolumbar muscle strain after a near fall at home.  Exam is overall benign reassuring at this time.  X-rays negative any acute fracture or dislocation.  Patient we discharged with prescriptions for Toradol and Flexeril to take as directed.  He is advised to follow-up with a local provider for ongoing symptoms or return to the ED as needed.  Brian Cole was evaluated in Emergency Department on 01/11/2020 for the symptoms described in the history of present illness. He was evaluated in the context of the global COVID-19 pandemic, which necessitated consideration that the patient might be at risk for infection with the SARS-CoV-2 virus that causes COVID-19. Institutional protocols and algorithms that pertain to the evaluation of patients at risk for COVID-19 are in a state of rapid change based on information released by regulatory bodies including the CDC and federal and state organizations. These policies and algorithms were followed during the patient's care in the ED. ____________________________________________  FINAL CLINICAL IMPRESSION(S) / ED DIAGNOSES  Final diagnoses:  Thoracolumbar back pain      Karmen Stabs, Charlesetta Ivory, PA-C 01/11/20 2331    Minna Antis, MD 01/14/20 1442

## 2020-01-10 NOTE — ED Triage Notes (Signed)
First Nurse Note:  Arrives via [facility].  Per report, patient 'tweeked back" yesterday.  Arrives today with worsening back pain.  Has history of herniated disc.

## 2021-08-23 IMAGING — CR DG LUMBAR SPINE 2-3V
1 series · 3 of 3 positions shown · non-contrast
Comparison: None.

CLINICAL DATA: Intense back pain

EXAM:
LUMBAR SPINE - 2-3 VIEW

[Series 1: dg lumbar spine 2-3 views · 0.14mm/px · 3 of 3 slices shown]
[im 1/3]
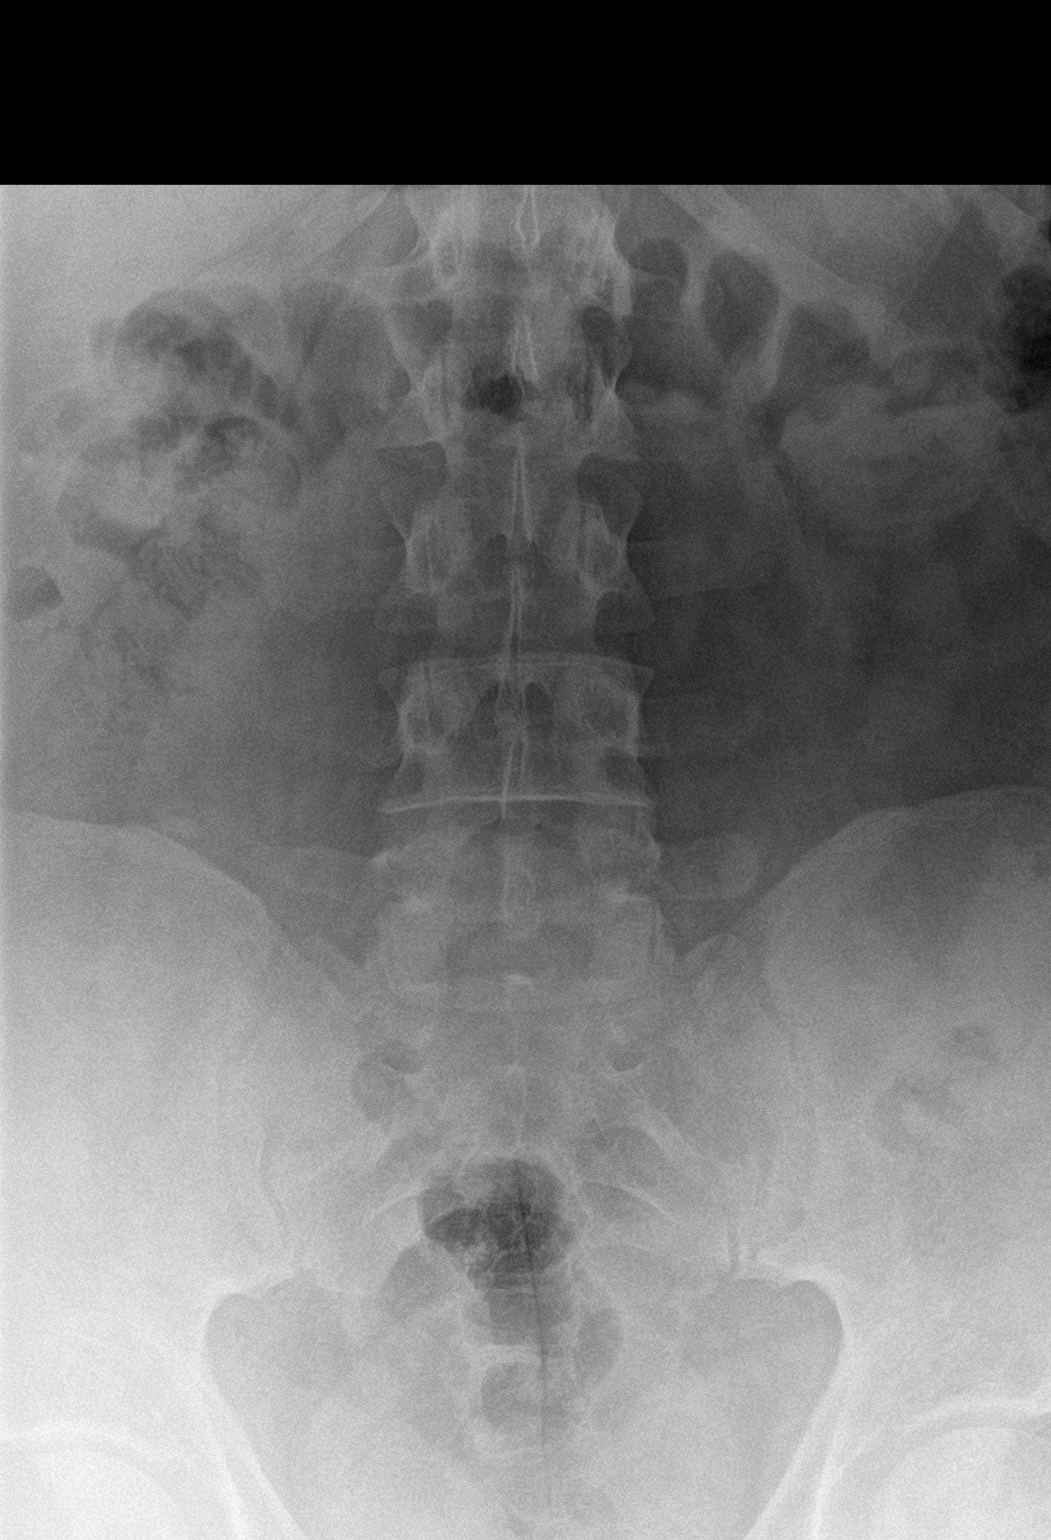
[im 2/3]
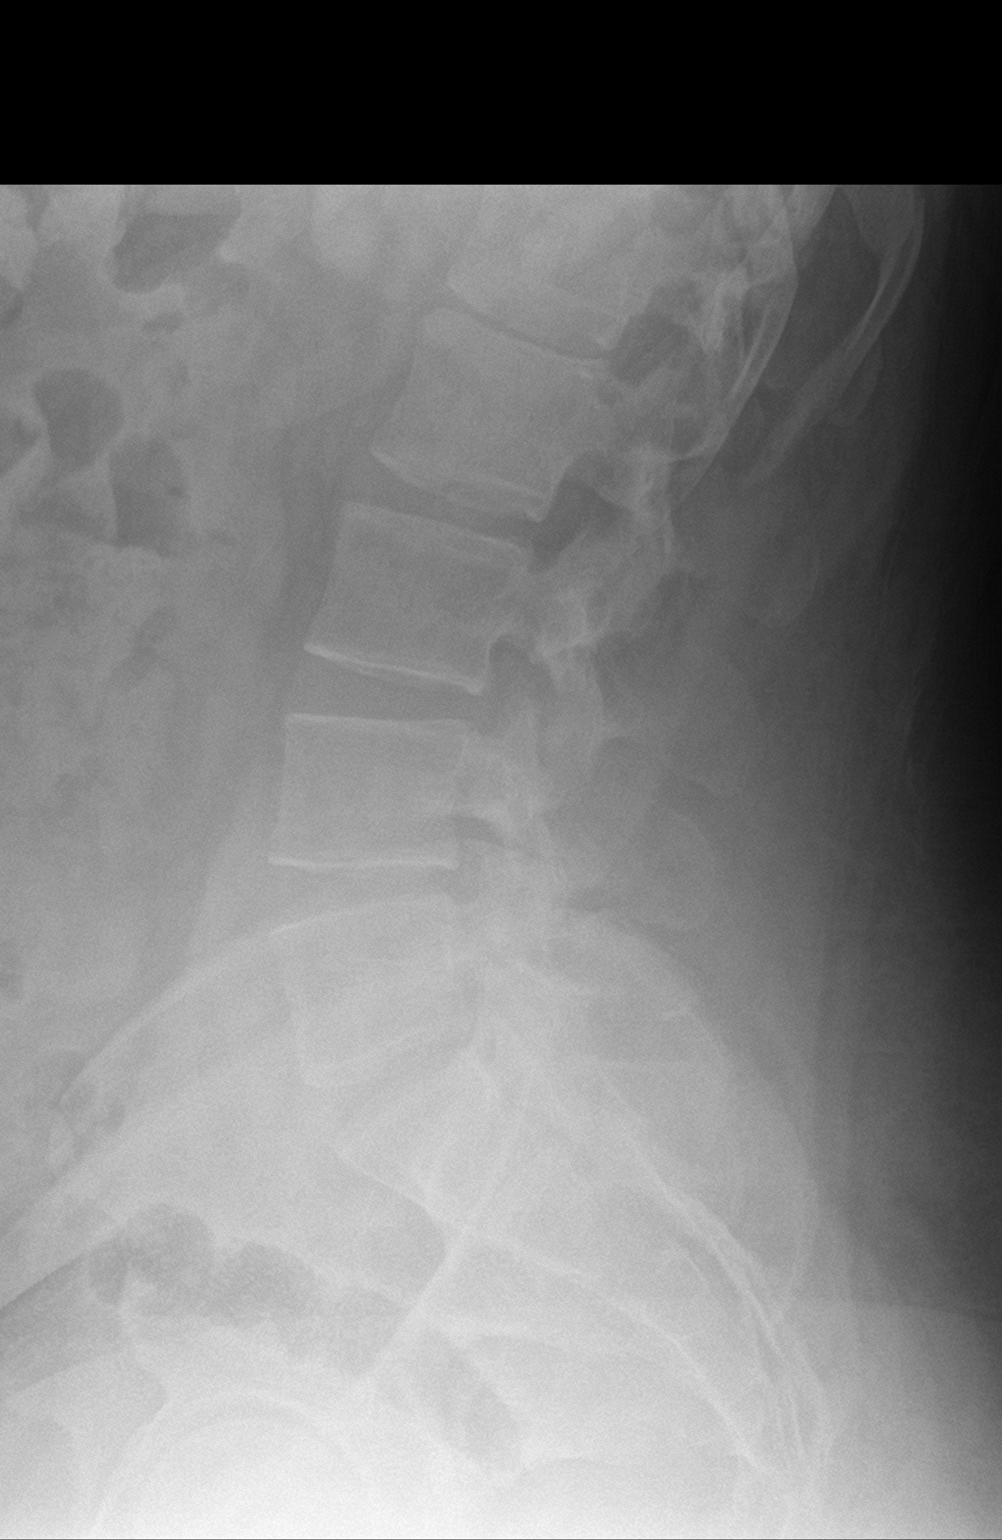
[im 3/3]
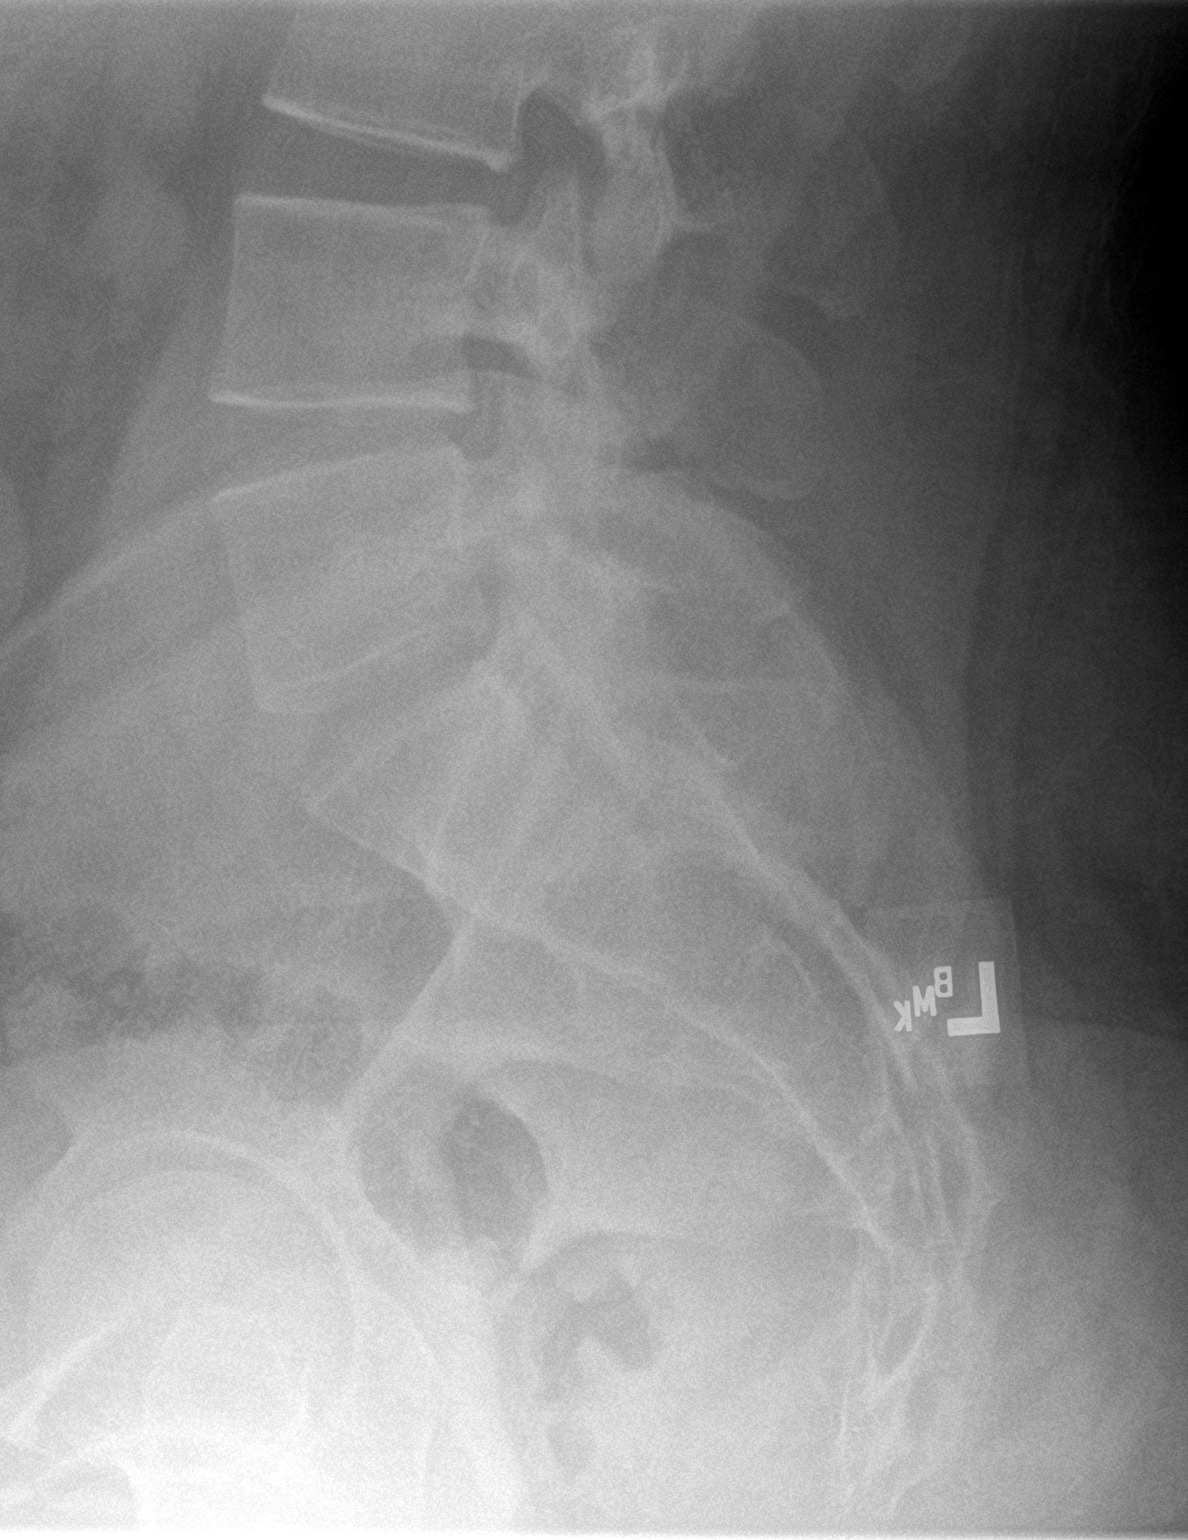

[3 of 3 positions shown; findings below may reference images not displayed]

FINDINGS: There is no evidence of lumbar spine fracture. Alignment is normal.
Intervertebral disc spaces are maintained.
IMPRESSION: Negative.

## 2022-02-13 ENCOUNTER — Encounter: Payer: Self-pay | Admitting: Emergency Medicine

## 2022-02-13 ENCOUNTER — Emergency Department: Payer: Medicaid Other

## 2022-02-13 ENCOUNTER — Emergency Department
Admission: EM | Admit: 2022-02-13 | Discharge: 2022-02-14 | Disposition: A | Discharge status: Home / Self Care | Payer: Medicaid Other | Attending: Emergency Medicine | Admitting: Emergency Medicine

## 2022-02-13 ENCOUNTER — Other Ambulatory Visit: Payer: Self-pay

## 2022-02-13 DIAGNOSIS — S92001A Unspecified fracture of right calcaneus, initial encounter for closed fracture: Secondary | ICD-10-CM | POA: Diagnosis not present

## 2022-02-13 DIAGNOSIS — Y92410 Unspecified street and highway as the place of occurrence of the external cause: Secondary | ICD-10-CM | POA: Diagnosis not present

## 2022-02-13 DIAGNOSIS — S301XXA Contusion of abdominal wall, initial encounter: Secondary | ICD-10-CM | POA: Diagnosis not present

## 2022-02-13 DIAGNOSIS — M542 Cervicalgia: Secondary | ICD-10-CM | POA: Diagnosis not present

## 2022-02-13 DIAGNOSIS — S20212A Contusion of left front wall of thorax, initial encounter: Secondary | ICD-10-CM | POA: Insufficient documentation

## 2022-02-13 DIAGNOSIS — D72829 Elevated white blood cell count, unspecified: Secondary | ICD-10-CM | POA: Diagnosis not present

## 2022-02-13 DIAGNOSIS — R519 Headache, unspecified: Secondary | ICD-10-CM | POA: Insufficient documentation

## 2022-02-13 DIAGNOSIS — S99911A Unspecified injury of right ankle, initial encounter: Secondary | ICD-10-CM | POA: Diagnosis present

## 2022-02-13 LAB — COMPREHENSIVE METABOLIC PANEL
ALT: 30 U/L (ref 0–44)
AST: 42 U/L — ABNORMAL HIGH (ref 15–41)
Albumin: 4.3 g/dL (ref 3.5–5.0)
Alkaline Phosphatase: 93 U/L (ref 38–126)
Anion gap: 10 (ref 5–15)
BUN: 7 mg/dL (ref 6–20)
CO2: 26 mmol/L (ref 22–32)
Calcium: 9 mg/dL (ref 8.9–10.3)
Chloride: 102 mmol/L (ref 98–111)
Creatinine, Ser: 1.07 mg/dL (ref 0.61–1.24)
GFR, Estimated: >60 mL/min (ref >60)
Glucose, Bld: 139 mg/dL — ABNORMAL HIGH (ref 70–99)
Potassium: 2.9 mmol/L — ABNORMAL LOW (ref 3.5–5.1)
Sodium: 138 mmol/L (ref 135–145)
Total Bilirubin: 0.7 mg/dL (ref 0.3–1.2)
Total Protein: 8 g/dL (ref 6.5–8.1)

## 2022-02-13 LAB — CBC WITH DIFFERENTIAL/PLATELET
Abs Immature Granulocytes: 0.54 10*3/uL — ABNORMAL HIGH (ref 0.00–0.07)
Basophils Absolute: 0.1 10*3/uL (ref 0.0–0.1)
Basophils Relative: 0 %
Eosinophils Absolute: 0.2 10*3/uL (ref 0.0–0.5)
Eosinophils Relative: 1 %
HCT: 47.1 % (ref 39.0–52.0)
Hemoglobin: 16 g/dL (ref 13.0–17.0)
Immature Granulocytes: 2 %
Lymphocytes Relative: 12 %
Lymphs Abs: 3.3 10*3/uL (ref 0.7–4.0)
MCH: 35.2 pg — ABNORMAL HIGH (ref 26.0–34.0)
MCHC: 34 g/dL (ref 30.0–36.0)
MCV: 103.5 fL — ABNORMAL HIGH (ref 80.0–100.0)
Monocytes Absolute: 1.6 10*3/uL — ABNORMAL HIGH (ref 0.1–1.0)
Monocytes Relative: 6 %
Neutro Abs: 22.2 10*3/uL — ABNORMAL HIGH (ref 1.7–7.7)
Neutrophils Relative %: 79 %
Platelets: 415 10*3/uL — ABNORMAL HIGH (ref 150–400)
RBC: 4.55 MIL/uL (ref 4.22–5.81)
RDW: 13.2 % (ref 11.5–15.5)
Smear Review: NORMAL
WBC: 27.9 10*3/uL — ABNORMAL HIGH (ref 4.0–10.5)
nRBC: 0.1 % (ref 0.0–0.2)

## 2022-02-13 LAB — SAMPLE TO BLOOD BANK

## 2022-02-13 MED ORDER — OXYCODONE-ACETAMINOPHEN 5-325 MG PO TABS: 1.0000 | ORAL_TABLET | Freq: Four times a day (QID) | ORAL | 0 refills | Status: DC | PRN

## 2022-02-13 MED ORDER — ONDANSETRON HCL 4 MG/2ML IJ SOLN
4.0000 mg | Freq: Once | INTRAMUSCULAR | Status: AC
  Administered 2022-02-13: 4 mg via INTRAVENOUS

## 2022-02-13 MED ORDER — IOHEXOL 300 MG/ML  SOLN
100.0000 mL | Freq: Once | INTRAMUSCULAR | Status: AC | PRN
  Administered 2022-02-13: 100 mL via INTRAVENOUS

## 2022-02-13 MED ORDER — HYDROMORPHONE HCL 1 MG/ML IJ SOLN
1.0000 mg | Freq: Once | INTRAMUSCULAR | Status: AC
  Administered 2022-02-13: 1 mg via INTRAVENOUS
  Filled 2022-02-13: qty 1

## 2022-02-13 MED ORDER — MORPHINE SULFATE (PF) 4 MG/ML IV SOLN
4.0000 mg | Freq: Once | INTRAVENOUS | Status: AC
  Administered 2022-02-13: 4 mg via INTRAVENOUS
  Filled 2022-02-13: qty 1

## 2022-02-13 MED ORDER — ONDANSETRON HCL 4 MG/2ML IJ SOLN
INTRAMUSCULAR | Status: AC
  Filled 2022-02-13: qty 2

## 2022-02-13 NOTE — Discharge Instructions (Signed)
Please be sure to follow up with podiatry. 

## 2022-02-13 NOTE — ED Notes (Signed)
Pt transported to CT at this time.

## 2022-02-13 NOTE — ED Notes (Signed)
CT notified to take patient to CT without regard to labs. Lab notified orders placed for blood work sent down upon patient arrival.
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
## 2022-02-13 NOTE — ED Notes (Signed)
Pt provided with a drink at this time. Pt resting in bed with friend at bedside, currently on the phone with his mother. Call bell within reach, VSS at this time.
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
## 2022-02-13 NOTE — ED Notes (Signed)
Pt transported to CT at this time for CT of his foot.

## 2022-02-13 NOTE — ED Notes (Signed)
Lattie Haw, EDT at bedside to place splint at this time.

## 2022-02-13 NOTE — ED Notes (Signed)
EDP at bedside to discuss results of imaging with patient.

## 2022-02-13 NOTE — ED Notes (Signed)
Pt returned from CT at this time.  

## 2022-02-22 ENCOUNTER — Encounter: Payer: Self-pay | Admitting: Podiatry

## 2022-02-22 ENCOUNTER — Ambulatory Visit: Payer: Medicaid Other | Admitting: Podiatry

## 2022-02-22 DIAGNOSIS — S92061A Displaced intraarticular fracture of right calcaneus, initial encounter for closed fracture: Secondary | ICD-10-CM
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
## 2022-03-08 ENCOUNTER — Other Ambulatory Visit: Payer: Self-pay | Admitting: Orthopaedic Surgery

## 2022-03-10 ENCOUNTER — Encounter (HOSPITAL_COMMUNITY): Payer: Self-pay | Admitting: Orthopaedic Surgery

## 2022-03-10 ENCOUNTER — Other Ambulatory Visit: Payer: Self-pay

## 2022-03-10 NOTE — Progress Notes (Signed)
PCP - Dr. Tobie Lords  Cardiologist - Denies  EP- Denies  Endocrine- Denies  Pulm- Denies  Chest x-ray - Denies  EKG - 02/13/22 (E)  Stress Test - Denies  ECHO - Denies  Cardiac Cath - Denies  AICD-na PM-na LOOP-na  Nerve Stimulator- Denies  Dialysis- Denies  Sleep Study - Denies CPAP - Denies  LABS- 03/11/22: CBC, BMP  ASA- Denies  ERAS- Yes- clears until 1130  HA1C- Denies  Anesthesia- Yes- EKG  Pt denies having chest pain, sob, or fever during the pre-op phone call. All instructions explained to the pt, with a verbal understanding of the material. Pt also instructed to wear a mask and social distance if he goes out. The opportunity to ask questions was provided.

## 2022-03-10 NOTE — Progress Notes (Signed)
S.D.W- Instructions   Your procedure is scheduled on Thurs., Nov. 2, 2023 from 2:30PM-5:32PM.  Report to [facility] Main Entrance "A" at 12:00 P.M., then check in with the Admitting office.  Call this number if you have problems the morning of surgery:  [phone]   Remember:  Do not eat after midnight on Nov. 1st  You may drink clear liquids until 3 hours (11:30AM) prior to surgery time the morning of your surgery.   Clear liquids allowed are: Water, Non-Citrus Juices (without pulp), Carbonated Beverages, Clear Tea, Black Coffee ONLY (NO MILK, CREAM OR POWDERED CREAMER of any kind), and Gatorade    Take these medicines the morning of surgery with A SIP OF WATER: BuPROPion (WELLBUTRIN XL)  Levothyroxine (SYNTHROID) Pantoprazole (PROTONIX) TEGRETOL-XR  If Needed: Budesonide-formoterol (SYMBICORT) OxyCODONE (OXY IR/ROXICODONE)  As of today, STOP taking any Aspirin (unless otherwise instructed by your surgeon) Aleve, Naproxen, Ibuprofen, Motrin, Advil, Goody's, BC's, all herbal medications, fish oil, and all vitamins.          Do not wear jewelry. Do not wear lotions, powders, cologne or deodorant. Do not shave 48 hours prior to surgery.  Men may shave face and neck. Do not bring valuables to the hospital.  [facility] is not responsible for any belongings or valuables.    Do NOT Smoke (Tobacco/Vaping)  24 hours prior to your procedure  If you use a CPAP at night, you may bring your mask for your overnight stay.   Contacts, glasses, hearing aids, dentures or partials may not be worn into surgery, please bring cases for these belongings   For patients admitted to the hospital, discharge time will be determined by your treatment team.   Patients discharged the day of surgery will not be allowed to drive home, and someone needs to stay with them for 24 hours.  Special instructions:    Oral Hygiene is also important to reduce your risk of infection.  Remember - BRUSH YOUR TEETH  THE MORNING OF SURGERY WITH YOUR REGULAR TOOTHPASTE  [place]- Preparing For Surgery  Before surgery, you can play an important role. Because skin is not sterile, your skin needs to be as free of germs as possible. You can reduce the number of germs on your skin by washing with Antibacterial Soap before surgery.     Please follow these instructions carefully.     Shower the NIGHT BEFORE SURGERY and the MORNING OF SURGERY with Antibacterial Soap.   Pat yourself dry with a CLEAN TOWEL.  Wear CLEAN PAJAMAS to bed the night before surgery  Place CLEAN SHEETS on your bed the night before your surgery  DO NOT SLEEP WITH PETS.  Day of Surgery:  Take a shower with Antibacterial soap. Wear Clean/Comfortable clothing the morning of surgery Do not apply any deodorants/lotions.   Remember to brush your teeth WITH YOUR REGULAR TOOTHPASTE.   If you test positive for Covid, or been in contact with anyone that has tested positive in the last 10 days, please notify your surgeon.  SURGICAL WAITING ROOM VISITATION Patients having surgery or a procedure may have no more than 2 support people in the waiting area - these visitors may rotate.   Children under the age of 24 must have an adult with them who is not the patient. If the patient needs to stay at the hospital during part of their recovery, the visitor guidelines for inpatient rooms apply. Pre-op nurse will coordinate an appropriate time for 1 support person to  accompany patient in pre-op.  This support person may not rotate.   Please refer to the [facility] website for the visitor guidelines for Inpatients (after your surgery is over and you are in a regular room).

## 2022-03-11 ENCOUNTER — Ambulatory Visit (HOSPITAL_COMMUNITY): Payer: Medicaid Other

## 2022-03-11 ENCOUNTER — Other Ambulatory Visit: Payer: Self-pay

## 2022-03-11 ENCOUNTER — Encounter (HOSPITAL_COMMUNITY): Payer: Self-pay | Admitting: Orthopaedic Surgery

## 2022-03-11 ENCOUNTER — Ambulatory Visit (HOSPITAL_COMMUNITY)
Admission: RE | Admit: 2022-03-11 | Discharge: 2022-03-11 | Disposition: A | Discharge status: Home / Self Care | Payer: Medicaid Other | Attending: Orthopaedic Surgery | Admitting: Orthopaedic Surgery

## 2022-03-11 ENCOUNTER — Encounter (HOSPITAL_COMMUNITY)
Admission: RE | Disposition: A | Discharge status: Home / Self Care | Payer: Self-pay | Source: Home / Self Care | Attending: Orthopaedic Surgery

## 2022-03-11 ENCOUNTER — Ambulatory Visit (HOSPITAL_BASED_OUTPATIENT_CLINIC_OR_DEPARTMENT_OTHER): Payer: Medicaid Other | Admitting: Physician Assistant

## 2022-03-11 ENCOUNTER — Ambulatory Visit (HOSPITAL_COMMUNITY): Payer: Medicaid Other | Admitting: Physician Assistant

## 2022-03-11 DIAGNOSIS — X58XXXA Exposure to other specified factors, initial encounter: Secondary | ICD-10-CM | POA: Diagnosis not present

## 2022-03-11 DIAGNOSIS — S92001A Unspecified fracture of right calcaneus, initial encounter for closed fracture: Secondary | ICD-10-CM | POA: Diagnosis not present

## 2022-03-11 DIAGNOSIS — S92061A Displaced intraarticular fracture of right calcaneus, initial encounter for closed fracture: Secondary | ICD-10-CM | POA: Insufficient documentation

## 2022-03-11 HISTORY — PX: TENOLYSIS: SHX396

## 2022-03-11 HISTORY — DX: Personal history of urinary calculi: Z87.442

## 2022-03-11 HISTORY — DX: Bipolar disorder, unspecified: F31.9

## 2022-03-11 HISTORY — DX: Depression, unspecified: F32.A

## 2022-03-11 HISTORY — DX: Urgency of urination: R39.15

## 2022-03-11 HISTORY — PX: OPEN REDUCTION, INTERNAL FIXATION (ORIF) CALCANEAL FRACTURE WITH FUSION: SHX5994

## 2022-03-11 LAB — BASIC METABOLIC PANEL
Anion gap: 10 (ref 5–15)
BUN: 7 mg/dL (ref 6–20)
CO2: 23 mmol/L (ref 22–32)
Calcium: 9.2 mg/dL (ref 8.9–10.3)
Chloride: 104 mmol/L (ref 98–111)
Creatinine, Ser: 0.92 mg/dL (ref 0.61–1.24)
GFR, Estimated: >60 mL/min (ref >60)
Glucose, Bld: 98 mg/dL (ref 70–99)
Potassium: 3.7 mmol/L (ref 3.5–5.1)
Sodium: 137 mmol/L (ref 135–145)

## 2022-03-11 LAB — CBC
HCT: 42 % (ref 39.0–52.0)
Hemoglobin: 14.4 g/dL (ref 13.0–17.0)
MCH: 36.9 pg — ABNORMAL HIGH (ref 26.0–34.0)
MCHC: 34.3 g/dL (ref 30.0–36.0)
MCV: 107.7 fL — ABNORMAL HIGH (ref 80.0–100.0)
Platelets: 411 10*3/uL — ABNORMAL HIGH (ref 150–400)
RBC: 3.9 MIL/uL — ABNORMAL LOW (ref 4.22–5.81)
RDW: 13.2 % (ref 11.5–15.5)
WBC: 10.4 10*3/uL (ref 4.0–10.5)
nRBC: 0 % (ref 0.0–0.2)

## 2022-03-11 LAB — SURGICAL PCR SCREEN
MRSA, PCR: NEGATIVE
Staphylococcus aureus: NEGATIVE

## 2022-03-11 SURGERY — OPEN REDUCTION, INTERNAL FIXATION (ORIF) CALCANEAL FRACTURE WITH FUSION
Anesthesia: General | Laterality: Right

## 2022-03-11 MED ORDER — ROCURONIUM BROMIDE 10 MG/ML (PF) SYRINGE
PREFILLED_SYRINGE | INTRAVENOUS | Status: DC | PRN
  Administered 2022-03-11: 40 mg via INTRAVENOUS
  Administered 2022-03-11: 60 mg via INTRAVENOUS
  Administered 2022-03-11: 20 mg via INTRAVENOUS

## 2022-03-11 MED ORDER — ACETAMINOPHEN 500 MG PO TABS
ORAL_TABLET | ORAL | Status: AC
  Filled 2022-03-11: qty 2

## 2022-03-11 MED ORDER — DEXAMETHASONE SODIUM PHOSPHATE 10 MG/ML IJ SOLN
INTRAMUSCULAR | Status: AC
  Filled 2022-03-11: qty 1

## 2022-03-11 MED ORDER — LIDOCAINE 2% (20 MG/ML) 5 ML SYRINGE
INTRAMUSCULAR | Status: DC | PRN
  Administered 2022-03-11: 40 mg via INTRAVENOUS

## 2022-03-11 MED ORDER — ALBUTEROL SULFATE HFA 108 (90 BASE) MCG/ACT IN AERS
INHALATION_SPRAY | RESPIRATORY_TRACT | Status: DC | PRN
  Administered 2022-03-11: 8 via RESPIRATORY_TRACT

## 2022-03-11 MED ORDER — PROPOFOL 10 MG/ML IV BOLUS
INTRAVENOUS | Status: DC | PRN
  Administered 2022-03-11: 150 mg via INTRAVENOUS
  Administered 2022-03-11: 50 mg via INTRAVENOUS

## 2022-03-11 MED ORDER — CHLORHEXIDINE GLUCONATE 0.12 % MT SOLN
15.0000 mL | Freq: Once | OROMUCOSAL | Status: AC
  Administered 2022-03-11: 15 mL via OROMUCOSAL
  Filled 2022-03-11: qty 15

## 2022-03-11 MED ORDER — ALBUTEROL SULFATE HFA 108 (90 BASE) MCG/ACT IN AERS
INHALATION_SPRAY | RESPIRATORY_TRACT | Status: AC
  Filled 2022-03-11: qty 6.7

## 2022-03-11 MED ORDER — ROCURONIUM BROMIDE 10 MG/ML (PF) SYRINGE
PREFILLED_SYRINGE | INTRAVENOUS | Status: AC
  Filled 2022-03-11: qty 10

## 2022-03-11 MED ORDER — SUGAMMADEX SODIUM 500 MG/5ML IV SOLN
INTRAVENOUS | Status: AC
  Filled 2022-03-11: qty 5

## 2022-03-11 MED ORDER — FENTANYL CITRATE (PF) 250 MCG/5ML IJ SOLN
INTRAMUSCULAR | Status: DC | PRN
  Administered 2022-03-11 (×2): 25 ug via INTRAVENOUS
  Administered 2022-03-11: 150 ug via INTRAVENOUS
  Administered 2022-03-11 (×2): 25 ug via INTRAVENOUS

## 2022-03-11 MED ORDER — HYDROMORPHONE HCL 1 MG/ML IJ SOLN
INTRAMUSCULAR | Status: AC
  Filled 2022-03-11: qty 0.5

## 2022-03-11 MED ORDER — ONDANSETRON HCL 4 MG/2ML IJ SOLN
INTRAMUSCULAR | Status: DC | PRN
  Administered 2022-03-11: 4 mg via INTRAVENOUS

## 2022-03-11 MED ORDER — ASPIRIN 325 MG PO TABS: 325.0000 mg | ORAL_TABLET | Freq: Every day | ORAL | 0 refills | Status: AC

## 2022-03-11 MED ORDER — LIDOCAINE 2% (20 MG/ML) 5 ML SYRINGE
INTRAMUSCULAR | Status: AC
  Filled 2022-03-11: qty 5

## 2022-03-11 MED ORDER — FENTANYL CITRATE (PF) 100 MCG/2ML IJ SOLN
INTRAMUSCULAR | Status: AC
  Administered 2022-03-11: 100 ug via INTRAVENOUS
  Filled 2022-03-11: qty 2

## 2022-03-11 MED ORDER — PROPOFOL 10 MG/ML IV BOLUS
INTRAVENOUS | Status: AC
  Filled 2022-03-11: qty 20

## 2022-03-11 MED ORDER — HYDROMORPHONE HCL 1 MG/ML IJ SOLN: 0.2500 mg | INTRAMUSCULAR | Status: DC | PRN

## 2022-03-11 MED ORDER — BUPIVACAINE-EPINEPHRINE (PF) 0.5% -1:200000 IJ SOLN
INTRAMUSCULAR | Status: DC | PRN
  Administered 2022-03-11: 30 mL via PERINEURAL
  Administered 2022-03-11: 15 mL via PERINEURAL

## 2022-03-11 MED ORDER — 0.9 % SODIUM CHLORIDE (POUR BTL) OPTIME
TOPICAL | Status: DC | PRN
  Administered 2022-03-11: 1000 mL

## 2022-03-11 MED ORDER — LABETALOL HCL 5 MG/ML IV SOLN
INTRAVENOUS | Status: DC | PRN
  Administered 2022-03-11 (×2): 2.5 mg via INTRAVENOUS
  Administered 2022-03-11: 5 mg via INTRAVENOUS
  Administered 2022-03-11 (×4): 2.5 mg via INTRAVENOUS

## 2022-03-11 MED ORDER — ORAL CARE MOUTH RINSE: 15.0000 mL | Freq: Once | OROMUCOSAL | Status: AC

## 2022-03-11 MED ORDER — SUGAMMADEX SODIUM 200 MG/2ML IV SOLN
INTRAVENOUS | Status: DC | PRN
  Administered 2022-03-11: 235.8 mg via INTRAVENOUS

## 2022-03-11 MED ORDER — FENTANYL CITRATE (PF) 250 MCG/5ML IJ SOLN
INTRAMUSCULAR | Status: AC
  Filled 2022-03-11: qty 5

## 2022-03-11 MED ORDER — OXYCODONE HCL 5 MG PO TABS: ORAL_TABLET | ORAL | 0 refills | Status: DC

## 2022-03-11 MED ORDER — HYDROMORPHONE HCL 1 MG/ML IJ SOLN
INTRAMUSCULAR | Status: DC | PRN
  Administered 2022-03-11 (×2): .1 mg via INTRAVENOUS
  Administered 2022-03-11: .2 mg via INTRAVENOUS
  Administered 2022-03-11: .1 mg via INTRAVENOUS

## 2022-03-11 MED ORDER — ONDANSETRON HCL 4 MG/2ML IJ SOLN
INTRAMUSCULAR | Status: AC
  Filled 2022-03-11: qty 2

## 2022-03-11 MED ORDER — ACETAMINOPHEN 500 MG PO TABS
1000.0000 mg | ORAL_TABLET | Freq: Once | ORAL | Status: AC
  Administered 2022-03-11: 1000 mg via ORAL

## 2022-03-11 MED ORDER — LABETALOL HCL 5 MG/ML IV SOLN
INTRAVENOUS | Status: AC
  Filled 2022-03-11: qty 4

## 2022-03-11 MED ORDER — DEXAMETHASONE SODIUM PHOSPHATE 10 MG/ML IJ SOLN
INTRAMUSCULAR | Status: DC | PRN
  Administered 2022-03-11: 10 mg via INTRAVENOUS

## 2022-03-11 MED ORDER — LACTATED RINGERS IV SOLN: INTRAVENOUS | Status: DC

## 2022-03-11 MED ORDER — MIDAZOLAM HCL 2 MG/2ML IJ SOLN
INTRAMUSCULAR | Status: AC
  Administered 2022-03-11: 2 mg via INTRAVENOUS
  Filled 2022-03-11: qty 2

## 2022-03-11 MED ORDER — FENTANYL CITRATE (PF) 100 MCG/2ML IJ SOLN: 100.0000 ug | Freq: Once | INTRAMUSCULAR | Status: AC

## 2022-03-11 MED ORDER — VANCOMYCIN HCL 1500 MG/300ML IV SOLN
1500.0000 mg | INTRAVENOUS | Status: AC
  Administered 2022-03-11: 1500 mg via INTRAVENOUS
  Filled 2022-03-11 (×2): qty 300

## 2022-03-11 MED ORDER — MIDAZOLAM HCL 2 MG/2ML IJ SOLN: 2.0000 mg | Freq: Once | INTRAMUSCULAR | Status: AC

## 2022-03-11 SURGICAL SUPPLY — 62 items
BAG COUNTER SPONGE SURGICOUNT (BAG) IMPLANT
BANDAGE ESMARK 6X9 LF (GAUZE/BANDAGES/DRESSINGS) IMPLANT
BENZOIN TINCTURE PRP APPL 2/3 (GAUZE/BANDAGES/DRESSINGS) IMPLANT
BIT DRILL 2.5 CANN STRL (BIT) IMPLANT
BLADE SURG 15 STRL LF DISP TIS (BLADE) ×2 IMPLANT
BLADE SURG 15 STRL SS (BLADE) ×2
BNDG ELASTIC 4X5.8 VLCR STR LF (GAUZE/BANDAGES/DRESSINGS) IMPLANT
BNDG ELASTIC 6X10 VLCR STRL LF (GAUZE/BANDAGES/DRESSINGS) ×1 IMPLANT
BNDG ESMARK 6X9 LF (GAUZE/BANDAGES/DRESSINGS)
BONE CANC CHIPS 20CC PCAN1/4 (Bone Implant) ×1 IMPLANT
CEMENT BONESYNC FAST SET 3 (Cement) IMPLANT
CHIPS CANC BONE 20CC PCAN1/4 (Bone Implant) ×1 IMPLANT
CHLORAPREP W/TINT 26 (MISCELLANEOUS) ×1 IMPLANT
CUFF TOURN SGL QUICK 34 (TOURNIQUET CUFF) ×1
CUFF TRNQT CYL 34X4.125X (TOURNIQUET CUFF) ×1 IMPLANT
DRAPE C-ARM 42X72 X-RAY (DRAPES) ×1 IMPLANT
DRAPE C-ARMOR (DRAPES) ×1 IMPLANT
DRAPE EXTREMITY T 121X128X90 (DISPOSABLE) ×1 IMPLANT
DRAPE IMP U-DRAPE 54X76 (DRAPES) ×1 IMPLANT
DRAPE U-SHAPE 47X51 STRL (DRAPES) ×1 IMPLANT
ELECT REM PT RETURN 9FT ADLT (ELECTROSURGICAL) ×1
ELECTRODE REM PT RTRN 9FT ADLT (ELECTROSURGICAL) ×1 IMPLANT
GAUZE PAD ABD 8X10 STRL (GAUZE/BANDAGES/DRESSINGS) IMPLANT
GAUZE SPONGE 4X4 12PLY STRL (GAUZE/BANDAGES/DRESSINGS) ×1 IMPLANT
GAUZE SPONGE 4X4 12PLY STRL LF (GAUZE/BANDAGES/DRESSINGS) ×1 IMPLANT
GAUZE XEROFORM 5X9 LF (GAUZE/BANDAGES/DRESSINGS) IMPLANT
GLOVE BIOGEL M STRL SZ7.5 (GLOVE) ×1 IMPLANT
GLOVE BIOGEL PI IND STRL 8 (GLOVE) ×1 IMPLANT
GOWN STRL REUS W/ TWL LRG LVL3 (GOWN DISPOSABLE) ×1 IMPLANT
GOWN STRL REUS W/ TWL XL LVL3 (GOWN DISPOSABLE) ×1 IMPLANT
GOWN STRL REUS W/TWL LRG LVL3 (GOWN DISPOSABLE) ×1
GOWN STRL REUS W/TWL XL LVL3 (GOWN DISPOSABLE) ×1
GRAFT BNE CANC CHIPS 1-8 20CC (Bone Implant) IMPLANT
GUIDEWIRE 1.6 (WIRE) ×6
GUIDEWIRE ORTH 157X1.6XTROC (WIRE) IMPLANT
KIT BASIN OR (CUSTOM PROCEDURE TRAY) ×1 IMPLANT
NS IRRIG 1000ML POUR BTL (IV SOLUTION) ×1 IMPLANT
PACK ORTHO EXTREMITY (CUSTOM PROCEDURE TRAY) ×1 IMPLANT
PAD CAST 4YDX4 CTTN HI CHSV (CAST SUPPLIES) ×1 IMPLANT
PADDING CAST COTTON 4X4 STRL (CAST SUPPLIES) ×1
PADDING CAST SYNTHETIC 4X4 STR (CAST SUPPLIES) ×1 IMPLANT
PIN SCHANZ FIXATION SD 5 (EXFIX) IMPLANT
PLATE CALC PERIMETER MED RT (Plate) IMPLANT
SCREW CORT TI FT 3.5X38 (Screw) IMPLANT
SCREW KREULOCK 3.5X36 (Screw) IMPLANT
SCREW KREULOCK CLAV 3.5X34 (Screw) IMPLANT
SCREW KREULOCK CLAV 3.5X40 (Screw) IMPLANT
SCREW LP TITANIUM 3.5X40 (Screw) IMPLANT
SPIKE FLUID TRANSFER (MISCELLANEOUS) IMPLANT
SPLINT PLASTER CAST XFAST 5X30 (CAST SUPPLIES) IMPLANT
SPONGE T-LAP 18X18 ~~LOC~~+RFID (SPONGE) IMPLANT
STRIP CLOSURE SKIN 1/2X4 (GAUZE/BANDAGES/DRESSINGS) IMPLANT
SUCTION FRAZIER HANDLE 10FR (MISCELLANEOUS) ×1
SUCTION TUBE FRAZIER 10FR DISP (MISCELLANEOUS) ×1 IMPLANT
SUT ETHILON 3 0 PS 1 (SUTURE) ×1 IMPLANT
SUT MNCRL AB 3-0 PS2 18 (SUTURE) ×1 IMPLANT
SUT PDS AB 2-0 CT2 27 (SUTURE) ×1 IMPLANT
SUT VIC AB 0 CT1 27 (SUTURE)
SUT VIC AB 0 CT1 27XBRD ANBCTR (SUTURE) IMPLANT
TOWEL GREEN STERILE FF (TOWEL DISPOSABLE) ×2 IMPLANT
TUBE CONNECTING 20X1/4 (TUBING) ×2 IMPLANT
UNDERPAD 30X36 HEAVY ABSORB (UNDERPADS AND DIAPERS) ×1 IMPLANT

## 2022-03-11 NOTE — Anesthesia Procedure Notes (Signed)
Anesthesia Regional Block: Popliteal block   Pre-Anesthetic Checklist: , timeout performed,  Correct Patient, Correct Site, Correct Laterality,  Correct Procedure, Correct Position, site marked,  Risks and benefits discussed,  Pre-op evaluation,  At surgeon's request and post-op pain management  Laterality: Right  Prep: Maximum Sterile Barrier Precautions used, chloraprep       Needles:  Injection technique: Single-shot  Needle Type: Echogenic Stimulator Needle     Needle Length: 9cm  Needle Gauge: 21     Additional Needles:   Procedures:,,,, ultrasound used (permanent image in chart),,    Narrative:  Start time: 03/11/2022 1:06 PM End time: 03/11/2022 1:16 PM Injection made incrementally with aspirations every 5 mL.  Performed by: Personally  Anesthesiologist: Roderic Palau, MD  Additional Notes: Adductor Canal block with 15cc of 0.5% Bupivicaine w/1:200k epi.

## 2022-03-11 NOTE — Anesthesia Procedure Notes (Signed)
Procedure Name: Intubation Date/Time: 03/11/2022 2:53 PM  Performed by: Maude Leriche, CRNAPre-anesthesia Checklist: Patient identified, Emergency Drugs available, Suction available and Patient being monitored Patient Re-evaluated:Patient Re-evaluated prior to induction Oxygen Delivery Method: Circle system utilized Preoxygenation: Pre-oxygenation with 100% oxygen Induction Type: IV induction Ventilation: Mask ventilation without difficulty Laryngoscope Size: Miller and 2 Grade View: Grade I Tube type: Oral Tube size: 7.5 mm Number of attempts: 1 Placement Confirmation: ETT inserted through vocal cords under direct vision, positive ETCO2 and breath sounds checked- equal and bilateral Secured at: 23 cm Tube secured with: Tape Dental Injury: Teeth and Oropharynx as per pre-operative assessment

## 2022-03-11 NOTE — Transfer of Care (Signed)
Immediate Anesthesia Transfer of Care Note  Patient: Brian Cole  Procedure(s) Performed: OPEN TREATMENT RIGHT CALCANEUS FRACTURE (Right) RIGHT PERONEAL TENDON DEBRIDEMENT (Right)  Patient Location: PACU  Anesthesia Type:GA combined with regional for post-op pain  Level of Consciousness: awake, alert , and oriented  Airway & Oxygen Therapy: Patient Spontanous Breathing and Patient connected to nasal cannula oxygen  Post-op Assessment: Report given to RN, Post -op Vital signs reviewed and stable, Patient moving all extremities X 4, and Patient able to stick tongue midline  Post vital signs: Reviewed  Last Vitals:  Vitals Value Taken Time  BP 120/66 03/11/22 1749  Temp 97.7   Pulse 78 03/11/22 1751  Resp 19 03/11/22 1751  SpO2 92 % 03/11/22 1751  Vitals shown include unvalidated device data.  Last Pain:  Vitals:   03/11/22 1339  TempSrc:   PainSc: 0-No pain      Patients Stated Pain Goal: 0 (93/81/01 7510)  Complications: No notable events documented.

## 2022-03-11 NOTE — Anesthesia Preprocedure Evaluation (Addendum)
Anesthesia Evaluation  Patient identified by MRN, date of birth, ID band Patient awake    Reviewed: Allergy & Precautions, H&P , NPO status , Patient's Chart, lab work & pertinent test results  Airway Mallampati: I  TM Distance: >3 FB Neck ROM: Full    Dental no notable dental hx. (+) Edentulous Upper, Edentulous Lower, Dental Advisory Given   Pulmonary Current SmokerPatient did not abstain from smoking.   Pulmonary exam normal breath sounds clear to auscultation       Cardiovascular negative cardio ROS  Rhythm:Regular Rate:Normal     Neuro/Psych    Depression Bipolar Disorder   negative neurological ROS     GI/Hepatic negative GI ROS, Neg liver ROS,,,  Endo/Other  negative endocrine ROS    Renal/GU negative Renal ROS  negative genitourinary   Musculoskeletal   Abdominal   Peds  Hematology negative hematology ROS (+)   Anesthesia Other Findings   Reproductive/Obstetrics negative OB ROS                             Anesthesia Physical Anesthesia Plan  ASA: 2  Anesthesia Plan: General   Post-op Pain Management: Regional block*, Tylenol PO (pre-op)* and Toradol IV (intra-op)*   Induction: Intravenous  PONV Risk Score and Plan: 1 and Midazolam, Dexamethasone and Ondansetron  Airway Management Planned: LMA  Additional Equipment:   Intra-op Plan:   Post-operative Plan: Extubation in OR  Informed Consent: I have reviewed the patients History and Physical, chart, labs and discussed the procedure including the risks, benefits and alternatives for the proposed anesthesia with the patient or authorized representative who has indicated his/her understanding and acceptance.     Dental advisory given  Plan Discussed with: CRNA  Anesthesia Plan Comments:        Anesthesia Quick Evaluation

## 2022-03-11 NOTE — H&P (Signed)
PREOPERATIVE H&P  Chief Complaint: Issues right foot pain  HPI: Brian Cole is a 36 y.o. male who presents for preoperative history and physical with a diagnosis of right calcaneus fracture with peroneal tendon dislocation and likely peroneal tendon tearing patient is seen in the preoperative holding area and is here for surgery.  He has been maintaining nonweightbearing.  Pain has been improving.. Symptoms are rated as moderate to severe, and have been worsening.  This is significantly impairing activities of daily living.  He has elected for surgical management.   Past Medical History:  Diagnosis Date   Bipolar disorder (HCC)    Depression    History of kidney stones    Murmur    Thyroid disease    Urgency of urination    Past Surgical History:  Procedure Laterality Date   WISDOM TOOTH EXTRACTION     23 teeth removed   Social History   Socioeconomic History   Marital status: Single    Spouse name: Not on file   Number of children: Not on file   Years of education: Not on file   Highest education level: Not on file  Occupational History   Not on file  Tobacco Use   Smoking status: Every Day    Packs/day: 0.25    Types: Cigarettes   Smokeless tobacco: Never  Vaping Use   Vaping Use: Never used  Substance and Sexual Activity   Alcohol use: Not Currently   Drug use: Yes    Types: Marijuana    Comment: Daily   Sexual activity: Yes  Other Topics Concern   Not on file  Social History Narrative   Not on file   Social Determinants of Health   Financial Resource Strain: Not on file  Food Insecurity: Not on file  Transportation Needs: Not on file  Physical Activity: Not on file  Stress: Not on file  Social Connections: Not on file   History reviewed. No pertinent family history. Allergies  Allergen Reactions   Penicillins Hives   Prior to Admission medications   Medication Sig Start Date End Date Taking? Authorizing Provider  atorvastatin (LIPITOR) 40 MG  tablet Take 40 mg by mouth every evening. 02/10/22  Yes [provider]  budesonide-formoterol (SYMBICORT) 160-4.5 MCG/ACT inhaler Inhale 2 puffs into the lungs 2 (two) times daily as needed (Shortness of breath).   Yes [provider]  buPROPion (WELLBUTRIN XL) 300 MG 24 hr tablet Take 300 mg by mouth daily. 02/10/22  Yes [provider]  Clindamycin-Benzoyl Per, Refr, gel Apply 1 Application topically daily as needed (abscess). 01/12/22  Yes [provider]  DOXYCYCLINE HYCLATE PO Take 100 mg by mouth daily.   Yes [provider]  levothyroxine (SYNTHROID) 50 MCG tablet Take 50 mcg by mouth daily before breakfast.   Yes [provider]  mupirocin ointment (BACTROBAN) 2 % Apply 1 Application topically daily as needed (Abscess). 09/15/21  Yes [provider]  oxyCODONE (OXY IR/ROXICODONE) 5 MG immediate release tablet Take 5 mg by mouth every 6 (six) hours as needed for pain. 03/05/22  Yes [provider]  pantoprazole (PROTONIX) 40 MG tablet Take 40 mg by mouth daily. 02/10/22  Yes [provider]  TEGRETOL-XR 200 MG 12 hr tablet Take 200 mg by mouth 2 (two) times daily. 02/10/22  Yes [provider]  Vitamin D, Ergocalciferol, (DRISDOL) 1.25 MG (50000 UNIT) CAPS capsule Take 50,000 Units by mouth once a week. 02/10/22  Yes [provider]     Positive ROS: All other systems have been reviewed and were otherwise negative with the exception of those mentioned in the HPI and as above.  Physical Exam:  Vitals:   03/11/22 1300 03/11/22 1305  BP: 101/67 105/67  Pulse: 84 94  Resp: 11 15  Temp:    SpO2: 97% 100%   General: Alert, no acute distress Cardiovascular: No pedal edema Respiratory: No cyanosis, no use of accessory musculature GI: No organomegaly, abdomen is soft and non-tender Skin: No lesions in the area of chief complaint Neurologic: Sensation intact distally Psychiatric: Patient is  competent for consent with normal mood and affect Lymphatic: No axillary or cervical lymphadenopathy  MUSCULOSKELETAL: Right ankle in a short leg splint.  Foot is swollen but amenable for surgery.  Tender to palpation about the hindfoot.  Did not assess for range of motion.  Sensation grossly intact distally.  Assessment: Right calcaneus fracture, comminuted and depressed with peroneal tendon dislocation   Plan: Plan for open treatment of his calcaneus fracture with peroneal tendon repair and repair of dislocated peroneal tendons.  We discussed the risks, benefits and alternatives of surgery which include but are not limited to wound healing complications, infection, nonunion, malunion, need for further surgery, damage to surrounding structures and continued pain.  They understand there is no guarantees to an acceptable outcome.  After weighing these risks they opted to proceed with surgery.     Erle Crocker, MD    03/11/2022 1:28 PM

## 2022-03-11 NOTE — Discharge Instructions (Signed)

## 2022-03-12 ENCOUNTER — Encounter (HOSPITAL_COMMUNITY): Payer: Self-pay | Admitting: Orthopaedic Surgery

## 2022-03-15 NOTE — Anesthesia Postprocedure Evaluation (Signed)
Anesthesia Post Note  Patient: AZION CENTRELLA  Procedure(s) Performed: OPEN TREATMENT RIGHT CALCANEUS FRACTURE (Right) RIGHT PERONEAL TENDON DEBRIDEMENT (Right)     Patient location during evaluation: PACU Anesthesia Type: General Level of consciousness: awake and alert Pain management: pain level controlled Vital Signs Assessment: post-procedure vital signs reviewed and stable Respiratory status: spontaneous breathing, nonlabored ventilation, respiratory function stable and patient connected to nasal cannula oxygen Cardiovascular status: blood pressure returned to baseline and stable Postop Assessment: no apparent nausea or vomiting Anesthetic complications: no  No notable events documented.  Last Vitals:  Vitals:   03/11/22 1815 03/11/22 1830  BP: 102/69 117/70  Pulse: 82 74  Resp: 12 11  Temp:  36.4 C  SpO2: 94% 93%    Last Pain:  Vitals:   03/11/22 1830  TempSrc:   PainSc: Asleep                 Naiomy Watters S

## 2022-03-18 NOTE — Op Note (Signed)
Brian Cole male 36 y.o. 03/11/2022  PreOperative Diagnosis: Right comminuted displaced intra-articular calcaneus fracture  PostOperative Diagnosis: Same  PROCEDURE: Open reduction internal fixation of right comminuted displaced intra-articular calcaneus fracture  SURGEON: Brian Mikes, MD  ASSISTANT: Brian [place], PA-C was necessary for patient positioning, prep, drape, assistance with retraction, placement of hardware, closure and splinting  ANESTHESIA: General with peripheral nerve block  FINDINGS: See below  IMPLANTS: Arthrex calcaneus locking plate  [name]:36 y.o. male sustained the above injury.  He had significant depression of the posterior facet of the subtalar joint as well as varus deformity through the calcaneal tuberosity.  Given the significant comminution and nature of his fracture he was indicated for surgery.   Patient understood the risks, benefits and alternatives to surgery which include but are not limited to wound healing complications, infection, nonunion, malunion, need for further surgery as well as damage to surrounding structures. They also understood the potential for continued pain in that there were no guarantees of acceptable outcome After weighing these risks the patient opted to proceed with surgery.  PROCEDURE: Patient was identified in the preoperative holding area.  The right leg was marked by myself.  Consent was signed by myself and the patient.  Block was performed by anesthesia in the preoperative holding area.  Patient was taken to the operative suite and placed supine on the operative table.  General anesthesia was induced without difficulty.  Tourniquet was placed on the operative thigh.  The patient was placed lateral on a beanbag.  All bony prominences were well padded.  Axillary roll was used.  Preoperative antibiotics were given. The extremity was prepped and draped in the usual sterile fashion and surgical timeout was  performed.  The limb was elevated and the tourniquet was inflated to 250 mmHg.  We began by marking out the sinus Tarsi incision.  It was from the tip of the fibula down the course of the fourth ray.  The incision was then made through skin and subcutaneous tissue.  Blunt dissection was used to mobilize skin flaps and blunt dissection was used to gain access down to the level of the peroneal retinaculum.  Peroneal retinaculum was identified and a small incision was made within the peroneal retinaculum.  A Glorious Peach was placed within the peroneal retinaculum up to the level of the superior peroneal retinaculum and this was stressed.  It was found to be stable.  The peroneal tendons were not dislocated.  There was no obvious tearing of the peroneal tendons.  We then proceeded with entering the sinus Tarsi.  Incision was made at the level of the sinus Tarsi and the subtalar joint was entered.  The calcaneus fracture was significantly depressed and there was fracture hematoma interposed with the fracture fragments.  There was a significant blowout of the lateral wall with depression of the posterior facet of the subtalar joint with multiple fragments in the joint.  There were large chondral surfaces that were spared.  Then using a Therapist, nutritional a Manufacturing engineer and around sure the fracture fragments were fully mobilized.  Interposed soft tissue, hematoma tissue and fibrous tissue was removed.  The fracture fragments were mobilized.  Then we sequentially reduced the fracture under direct visualization starting with the posterior facet pieces.  We had difficulty correcting the varus deformity through the tuberosity and therefore Schanz pin was placed within the posterior aspect of the heel and the tuberosity to aid with reduction.  This was done through a stab incision.  Then K wires were used to provisionally fixate the fractures once the intra-articular fragments and tuberosity pieces were acceptably reduced.   Then fluoroscopy confirmed appropriate reduction.  We were then able to place a locking calcaneus plate about the fracture site.  This was done with a combination of locking and nonlocking screws.  Then the K wires were removed and there is maintenance of reduction and acceptable reduction of the calcaneus fracture.  The tourniquet was released.  The wound was then irrigated with normal saline.  The wound was closed in a layered fashion including the retinacular tissue using a 2-0 Vicryl, 3-0 Monocryl and 3-0 nylon suture.  Soft dressing and short leg splint were placed.  Patient was awakened from anesthesia and taken recovery in stable condition.  No complications.  Counts were correct.  POST OPERATIVE INSTRUCTIONS: Nonweightbearing to operative extremity. Follow-up in 2 weeks for suture removal, x-rays of the foot and nonweightbearing and placement of a short leg cast DVT prophylaxis, aspirin   TOURNIQUET TIME: Less than 2 hours  BLOOD LOSS:  less than 50 mL         DRAINS: none         SPECIMEN: none       COMPLICATIONS:  * No complications entered in OR log *         Disposition: PACU - hemodynamically stable.         Condition: stable

## 2022-06-09 ENCOUNTER — Ambulatory Visit: Payer: Medicaid Other | Admitting: Physical Therapy

## 2022-06-10 ENCOUNTER — Other Ambulatory Visit: Payer: Self-pay

## 2022-06-10 ENCOUNTER — Encounter: Payer: Medicaid Other | Admitting: Physical Therapy

## 2022-06-10 ENCOUNTER — Ambulatory Visit: Payer: Medicaid Other | Attending: Orthopaedic Surgery | Admitting: Physical Therapy

## 2022-06-10 ENCOUNTER — Ambulatory Visit: Payer: Medicaid Other

## 2022-06-10 DIAGNOSIS — R262 Difficulty in walking, not elsewhere classified: Secondary | ICD-10-CM | POA: Diagnosis present

## 2022-06-10 DIAGNOSIS — M79671 Pain in right foot: Secondary | ICD-10-CM | POA: Insufficient documentation
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
## 2022-06-16 ENCOUNTER — Encounter: Payer: Medicaid Other | Admitting: Physical Therapy

## 2022-06-17 ENCOUNTER — Ambulatory Visit: Payer: Medicaid Other | Admitting: Physical Therapy

## 2022-06-17 ENCOUNTER — Encounter: Payer: Self-pay | Admitting: Physical Therapy

## 2022-06-17 DIAGNOSIS — M79671 Pain in right foot: Secondary | ICD-10-CM | POA: Diagnosis not present

## 2022-06-17 DIAGNOSIS — R262 Difficulty in walking, not elsewhere classified: Secondary | ICD-10-CM
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
## 2022-06-22 ENCOUNTER — Encounter: Payer: Self-pay | Admitting: Physical Therapy

## 2022-06-22 ENCOUNTER — Ambulatory Visit: Payer: Medicaid Other | Admitting: Physical Therapy

## 2022-06-22 DIAGNOSIS — M79671 Pain in right foot: Secondary | ICD-10-CM | POA: Diagnosis not present

## 2022-06-22 DIAGNOSIS — R262 Difficulty in walking, not elsewhere classified: Secondary | ICD-10-CM
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
## 2022-06-29 ENCOUNTER — Ambulatory Visit: Payer: Medicaid Other | Admitting: Physical Therapy

## 2022-07-01 ENCOUNTER — Encounter: Payer: Self-pay | Admitting: Physical Therapy

## 2022-07-01 ENCOUNTER — Ambulatory Visit: Payer: Medicaid Other | Admitting: Physical Therapy

## 2022-07-01 DIAGNOSIS — M79671 Pain in right foot: Secondary | ICD-10-CM | POA: Diagnosis not present

## 2022-07-01 DIAGNOSIS — R262 Difficulty in walking, not elsewhere classified: Secondary | ICD-10-CM
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
## 2022-07-06 ENCOUNTER — Ambulatory Visit: Payer: Medicaid Other | Admitting: Physical Therapy

## 2022-07-08 ENCOUNTER — Encounter: Payer: Self-pay | Admitting: Physical Therapy

## 2022-07-08 ENCOUNTER — Ambulatory Visit: Payer: Medicaid Other | Admitting: Physical Therapy

## 2022-07-08 DIAGNOSIS — R262 Difficulty in walking, not elsewhere classified: Secondary | ICD-10-CM

## 2022-07-08 DIAGNOSIS — M79671 Pain in right foot: Secondary | ICD-10-CM | POA: Diagnosis not present
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
## 2022-07-12 ENCOUNTER — Ambulatory Visit: Payer: Medicaid Other | Admitting: Physical Therapy

## 2022-07-14 ENCOUNTER — Ambulatory Visit: Payer: Medicaid Other | Attending: Orthopaedic Surgery | Admitting: Physical Therapy

## 2022-07-14 DIAGNOSIS — R262 Difficulty in walking, not elsewhere classified: Secondary | ICD-10-CM | POA: Diagnosis present

## 2022-07-14 DIAGNOSIS — M79671 Pain in right foot: Secondary | ICD-10-CM | POA: Diagnosis present
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
## 2022-07-19 ENCOUNTER — Ambulatory Visit: Payer: Medicaid Other | Admitting: Physical Therapy

## 2022-07-21 ENCOUNTER — Ambulatory Visit: Payer: Medicaid Other | Admitting: Physical Therapy

## 2022-07-21 DIAGNOSIS — M79671 Pain in right foot: Secondary | ICD-10-CM

## 2022-07-21 DIAGNOSIS — R262 Difficulty in walking, not elsewhere classified: Secondary | ICD-10-CM
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
## 2022-07-24 ENCOUNTER — Emergency Department: Payer: Medicaid Other

## 2022-07-24 ENCOUNTER — Inpatient Hospital Stay
Admission: EM | Admit: 2022-07-24 | Discharge: 2022-07-25 | DRG: 982 | Disposition: A | Discharge status: Home / Self Care | Payer: Medicaid Other | Attending: Internal Medicine | Admitting: Internal Medicine

## 2022-07-24 ENCOUNTER — Other Ambulatory Visit: Payer: Self-pay

## 2022-07-24 ENCOUNTER — Encounter: Payer: Self-pay | Admitting: Emergency Medicine

## 2022-07-24 DIAGNOSIS — Z880 Allergy status to penicillin: Secondary | ICD-10-CM | POA: Diagnosis not present

## 2022-07-24 DIAGNOSIS — Z79890 Hormone replacement therapy (postmenopausal): Secondary | ICD-10-CM

## 2022-07-24 DIAGNOSIS — R58 Hemorrhage, not elsewhere classified: Secondary | ICD-10-CM | POA: Diagnosis present

## 2022-07-24 DIAGNOSIS — K612 Anorectal abscess: Principal | ICD-10-CM | POA: Diagnosis present

## 2022-07-24 DIAGNOSIS — K610 Anal abscess: Secondary | ICD-10-CM | POA: Diagnosis not present

## 2022-07-24 DIAGNOSIS — E785 Hyperlipidemia, unspecified: Secondary | ICD-10-CM | POA: Diagnosis present

## 2022-07-24 DIAGNOSIS — Z981 Arthrodesis status: Secondary | ICD-10-CM | POA: Diagnosis not present

## 2022-07-24 DIAGNOSIS — F17210 Nicotine dependence, cigarettes, uncomplicated: Secondary | ICD-10-CM | POA: Diagnosis present

## 2022-07-24 DIAGNOSIS — Z7951 Long term (current) use of inhaled steroids: Secondary | ICD-10-CM

## 2022-07-24 DIAGNOSIS — Z79899 Other long term (current) drug therapy: Secondary | ICD-10-CM

## 2022-07-24 DIAGNOSIS — L03317 Cellulitis of buttock: Secondary | ICD-10-CM | POA: Diagnosis present

## 2022-07-24 DIAGNOSIS — E039 Hypothyroidism, unspecified: Secondary | ICD-10-CM | POA: Insufficient documentation

## 2022-07-24 DIAGNOSIS — F319 Bipolar disorder, unspecified: Secondary | ICD-10-CM | POA: Insufficient documentation

## 2022-07-24 DIAGNOSIS — Z6838 Body mass index (BMI) 38.0-38.9, adult: Secondary | ICD-10-CM | POA: Diagnosis not present

## 2022-07-24 DIAGNOSIS — L732 Hidradenitis suppurativa: Secondary | ICD-10-CM | POA: Diagnosis present

## 2022-07-24 DIAGNOSIS — Z87442 Personal history of urinary calculi: Secondary | ICD-10-CM

## 2022-07-24 DIAGNOSIS — L0231 Cutaneous abscess of buttock: Secondary | ICD-10-CM | POA: Diagnosis present

## 2022-07-24 DIAGNOSIS — K611 Rectal abscess: Secondary | ICD-10-CM | POA: Diagnosis not present

## 2022-07-24 LAB — COMPREHENSIVE METABOLIC PANEL
ALT: 16 U/L (ref 0–44)
AST: 18 U/L (ref 15–41)
Albumin: 3.9 g/dL (ref 3.5–5.0)
Alkaline Phosphatase: 92 U/L (ref 38–126)
Anion gap: 8 (ref 5–15)
BUN: 9 mg/dL (ref 6–20)
CO2: 26 mmol/L (ref 22–32)
Calcium: 9.1 mg/dL (ref 8.9–10.3)
Chloride: 101 mmol/L (ref 98–111)
Creatinine, Ser: 1.2 mg/dL (ref 0.61–1.24)
GFR, Estimated: >60 mL/min (ref >60)
Glucose, Bld: 114 mg/dL — ABNORMAL HIGH (ref 70–99)
Potassium: 3.6 mmol/L (ref 3.5–5.1)
Sodium: 135 mmol/L (ref 135–145)
Total Bilirubin: 0.5 mg/dL (ref 0.3–1.2)
Total Protein: 7.8 g/dL (ref 6.5–8.1)

## 2022-07-24 LAB — CBC WITH DIFFERENTIAL/PLATELET
Abs Immature Granulocytes: 0.07 10*3/uL (ref 0.00–0.07)
Basophils Absolute: 0.1 10*3/uL (ref 0.0–0.1)
Basophils Relative: 0 %
Eosinophils Absolute: 0.1 10*3/uL (ref 0.0–0.5)
Eosinophils Relative: 1 %
HCT: 40.9 % (ref 39.0–52.0)
Hemoglobin: 13.8 g/dL (ref 13.0–17.0)
Immature Granulocytes: 0 %
Lymphocytes Relative: 14 %
Lymphs Abs: 2.3 10*3/uL (ref 0.7–4.0)
MCH: 34.9 pg — ABNORMAL HIGH (ref 26.0–34.0)
MCHC: 33.7 g/dL (ref 30.0–36.0)
MCV: 103.5 fL — ABNORMAL HIGH (ref 80.0–100.0)
Monocytes Absolute: 1.6 10*3/uL — ABNORMAL HIGH (ref 0.1–1.0)
Monocytes Relative: 10 %
Neutro Abs: 12.2 10*3/uL — ABNORMAL HIGH (ref 1.7–7.7)
Neutrophils Relative %: 75 %
Platelets: 327 10*3/uL (ref 150–400)
RBC: 3.95 MIL/uL — ABNORMAL LOW (ref 4.22–5.81)
RDW: 12.2 % (ref 11.5–15.5)
WBC: 16.3 10*3/uL — ABNORMAL HIGH (ref 4.0–10.5)
nRBC: 0 % (ref 0.0–0.2)

## 2022-07-24 LAB — HEMOGLOBIN
Hemoglobin: 13.4 g/dL (ref 13.0–17.0)
Hemoglobin: 12.9 g/dL — ABNORMAL LOW (ref 13.0–17.0)
Hemoglobin: 12.9 g/dL — ABNORMAL LOW (ref 13.0–17.0)

## 2022-07-24 LAB — LACTIC ACID, PLASMA
Lactic Acid, Venous: 1.4 mmol/L (ref 0.5–1.9)
Lactic Acid, Venous: 1 mmol/L (ref 0.5–1.9)

## 2022-07-24 LAB — HIV ANTIBODY (ROUTINE TESTING W REFLEX): HIV Screen 4th Generation wRfx: NONREACTIVE

## 2022-07-24 MED ORDER — SODIUM CHLORIDE 0.9 % IV BOLUS
1000.0000 mL | Freq: Once | INTRAVENOUS | Status: AC
  Administered 2022-07-24: 1000 mL via INTRAVENOUS

## 2022-07-24 MED ORDER — PANTOPRAZOLE SODIUM 40 MG PO TBEC
40.0000 mg | DELAYED_RELEASE_TABLET | Freq: Every day | ORAL | Status: DC
  Administered 2022-07-25: 40 mg via ORAL
  Filled 2022-07-24: qty 1

## 2022-07-24 MED ORDER — ACETAMINOPHEN 325 MG PO TABS: 650.0000 mg | ORAL_TABLET | Freq: Four times a day (QID) | ORAL | Status: DC | PRN

## 2022-07-24 MED ORDER — LIDOCAINE-EPINEPHRINE 1 %-1:100000 IJ SOLN
20.0000 mL | Freq: Once | INTRAMUSCULAR | Status: AC
  Administered 2022-07-24: 10 mL
  Filled 2022-07-24: qty 20

## 2022-07-24 MED ORDER — OXYCODONE HCL 5 MG PO TABS
10.0000 mg | ORAL_TABLET | ORAL | Status: DC | PRN
  Administered 2022-07-24 (×2): 10 mg via ORAL
  Filled 2022-07-24 (×2): qty 2

## 2022-07-24 MED ORDER — ONDANSETRON HCL 4 MG PO TABS: 4.0000 mg | ORAL_TABLET | Freq: Four times a day (QID) | ORAL | Status: DC | PRN

## 2022-07-24 MED ORDER — VANCOMYCIN HCL 1250 MG/250ML IV SOLN
1250.0000 mg | Freq: Two times a day (BID) | INTRAVENOUS | Status: DC
  Administered 2022-07-24 – 2022-07-25 (×2): 1250 mg via INTRAVENOUS
  Filled 2022-07-24 (×3): qty 250

## 2022-07-24 MED ORDER — MORPHINE SULFATE (PF) 2 MG/ML IV SOLN
2.0000 mg | INTRAVENOUS | Status: DC | PRN
  Administered 2022-07-24 – 2022-07-25 (×3): 2 mg via INTRAVENOUS
  Filled 2022-07-24 (×3): qty 1

## 2022-07-24 MED ORDER — VANCOMYCIN HCL 1500 MG/300ML IV SOLN
1500.0000 mg | Freq: Once | INTRAVENOUS | Status: AC
  Administered 2022-07-24: 1500 mg via INTRAVENOUS
  Filled 2022-07-24: qty 300

## 2022-07-24 MED ORDER — ONDANSETRON HCL 4 MG/2ML IJ SOLN
4.0000 mg | Freq: Four times a day (QID) | INTRAMUSCULAR | Status: DC | PRN
  Administered 2022-07-24: 4 mg via INTRAVENOUS
  Filled 2022-07-24: qty 2

## 2022-07-24 MED ORDER — LEVOTHYROXINE SODIUM 50 MCG PO TABS
50.0000 ug | ORAL_TABLET | Freq: Every day | ORAL | Status: DC
  Administered 2022-07-24 – 2022-07-25 (×2): 50 ug via ORAL
  Filled 2022-07-24 (×2): qty 1

## 2022-07-24 MED ORDER — SODIUM CHLORIDE 0.9 % IV SOLN
2.0000 g | INTRAVENOUS | Status: DC
  Administered 2022-07-24: 2 g via INTRAVENOUS
  Filled 2022-07-24 (×2): qty 20

## 2022-07-24 MED ORDER — VANCOMYCIN HCL 1250 MG/250ML IV SOLN: 1250.0000 mg | Freq: Two times a day (BID) | INTRAVENOUS | Status: DC

## 2022-07-24 MED ORDER — IOHEXOL 350 MG/ML SOLN
100.0000 mL | Freq: Once | INTRAVENOUS | Status: AC | PRN
  Administered 2022-07-24: 100 mL via INTRAVENOUS

## 2022-07-24 MED ORDER — SODIUM CHLORIDE 0.9 % IV SOLN: INTRAVENOUS | Status: AC

## 2022-07-24 MED ORDER — ACETAMINOPHEN 500 MG PO TABS
1000.0000 mg | ORAL_TABLET | Freq: Four times a day (QID) | ORAL | Status: DC
  Administered 2022-07-24 – 2022-07-25 (×5): 1000 mg via ORAL
  Filled 2022-07-24 (×5): qty 2

## 2022-07-24 MED ORDER — METRONIDAZOLE 500 MG/100ML IV SOLN
500.0000 mg | Freq: Once | INTRAVENOUS | Status: AC
  Administered 2022-07-24: 500 mg via INTRAVENOUS
  Filled 2022-07-24: qty 100

## 2022-07-24 MED ORDER — PROPOFOL 10 MG/ML IV BOLUS
50.0000 mg | Freq: Once | INTRAVENOUS | Status: AC | PRN
  Administered 2022-07-24: 50 mg via INTRAVENOUS

## 2022-07-24 MED ORDER — VANCOMYCIN HCL IN DEXTROSE 1-5 GM/200ML-% IV SOLN
1000.0000 mg | Freq: Once | INTRAVENOUS | Status: AC
  Administered 2022-07-24: 1000 mg via INTRAVENOUS
  Filled 2022-07-24: qty 200

## 2022-07-24 MED ORDER — CARBAMAZEPINE ER 200 MG PO TB12
200.0000 mg | ORAL_TABLET | Freq: Two times a day (BID) | ORAL | Status: DC
  Administered 2022-07-24 – 2022-07-25 (×2): 200 mg via ORAL
  Filled 2022-07-24 (×3): qty 1

## 2022-07-24 MED ORDER — METRONIDAZOLE 500 MG/100ML IV SOLN
500.0000 mg | Freq: Three times a day (TID) | INTRAVENOUS | Status: DC
  Administered 2022-07-24 – 2022-07-25 (×3): 500 mg via INTRAVENOUS
  Filled 2022-07-24 (×4): qty 100

## 2022-07-24 MED ORDER — FENTANYL CITRATE PF 50 MCG/ML IJ SOSY
100.0000 ug | PREFILLED_SYRINGE | Freq: Once | INTRAMUSCULAR | Status: AC | PRN
  Administered 2022-07-24: 100 ug via INTRAVENOUS
  Filled 2022-07-24: qty 2

## 2022-07-24 MED ORDER — BUPROPION HCL ER (XL) 300 MG PO TB24
300.0000 mg | ORAL_TABLET | Freq: Every day | ORAL | Status: DC
  Administered 2022-07-25: 300 mg via ORAL
  Filled 2022-07-24: qty 1

## 2022-07-24 MED ORDER — PROPOFOL 10 MG/ML IV BOLUS
50.0000 mg | Freq: Once | INTRAVENOUS | Status: AC
  Administered 2022-07-24: 50 mg via INTRAVENOUS

## 2022-07-24 MED ORDER — ACETAMINOPHEN 650 MG RE SUPP: 650.0000 mg | Freq: Four times a day (QID) | RECTAL | Status: DC | PRN

## 2022-07-24 MED ORDER — KETOROLAC TROMETHAMINE 30 MG/ML IJ SOLN
30.0000 mg | Freq: Four times a day (QID) | INTRAMUSCULAR | Status: DC
  Administered 2022-07-24 – 2022-07-25 (×4): 30 mg via INTRAVENOUS
  Filled 2022-07-24 (×4): qty 1

## 2022-07-24 MED ORDER — PROPOFOL 10 MG/ML IV BOLUS
INTRAVENOUS | Status: AC | PRN
  Administered 2022-07-24 (×2): 25 mg via INTRAVENOUS
  Administered 2022-07-24: 10 mg via INTRAVENOUS
  Administered 2022-07-24: 25 mg via INTRAVENOUS

## 2022-07-24 MED ORDER — ATORVASTATIN CALCIUM 20 MG PO TABS
40.0000 mg | ORAL_TABLET | Freq: Every evening | ORAL | Status: DC
  Administered 2022-07-24: 40 mg via ORAL
  Filled 2022-07-24: qty 2

## 2022-07-24 MED ORDER — VANCOMYCIN HCL IN DEXTROSE 1-5 GM/200ML-% IV SOLN: 1000.0000 mg | Freq: Once | INTRAVENOUS | Status: DC

## 2022-07-24 MED ORDER — CHLORHEXIDINE GLUCONATE CLOTH 2 % EX PADS
6.0000 | MEDICATED_PAD | Freq: Every day | CUTANEOUS | Status: DC
  Administered 2022-07-25: 6 via TOPICAL
  Filled 2022-07-24: qty 6

## 2022-07-24 MED ORDER — PROPOFOL 10 MG/ML IV BOLUS
0.5000 mg/kg | Freq: Once | INTRAVENOUS | Status: DC
  Filled 2022-07-24: qty 20

## 2022-07-24 MED ORDER — PROPOFOL 10 MG/ML IV BOLUS: 0.5000 mg/kg | Freq: Once | INTRAVENOUS | Status: DC | PRN

## 2022-07-24 MED ORDER — SODIUM CHLORIDE 0.9 % IV SOLN
2.0000 g | INTRAVENOUS | Status: AC
  Administered 2022-07-24: 2 g via INTRAVENOUS
  Filled 2022-07-24: qty 20

## 2022-07-24 NOTE — Consult Note (Signed)
Patient ID: Brian Cole, male   DOB: October 15, 1985, 37 y.o.   MRN: [id number]  HPI Brian Cole is a 37 y.o. male seen in consultation at the request of Dr. Damita Dunnings.  He reports several months of perianal issues.  He has tried multiple antibiotic therapies and over the last few days pain is getting worse with some drainage.  The pain is sharp worsening when he sits and moderate to severe intensity.  Apparently did have some fever.  He has significant psychiatric history to include bipolar and he is on multiple mood stabilizers.  The scan personally reviewed showing evidence of a collection in the perirectal area 3.5 x 1 cm. CBC was positive for white count of 16,000.  Of the CBC was normal as well as a CMP    HPI  Past Medical History:  Diagnosis Date   Bipolar disorder ([place])    Depression    History of kidney stones    Murmur    Thyroid disease    Urgency of urination     Past Surgical History:  Procedure Laterality Date   OPEN REDUCTION, INTERNAL FIXATION (ORIF) CALCANEAL FRACTURE WITH FUSION Right 03/11/2022   Procedure: OPEN TREATMENT RIGHT CALCANEUS FRACTURE;  Surgeon: Erle Crocker, MD;  Location: [facility];  Service: Orthopedics;  Laterality: Right;  LENGTH OF SURGERY: 120 MINUTES   TENOLYSIS Right 03/11/2022   Procedure: RIGHT PERONEAL TENDON DEBRIDEMENT;  Surgeon: Erle Crocker, MD;  Location: [facility];  Service: Orthopedics;  Laterality: Right;   WISDOM TOOTH EXTRACTION     23 teeth removed    History reviewed. No pertinent family history.  Social History Social History   Tobacco Use   Smoking status: Every Day    Packs/day: .25    Types: Cigarettes   Smokeless tobacco: Never  Vaping Use   Vaping Use: Never used  Substance Use Topics   Alcohol use: Not Currently   Drug use: Yes    Types: Marijuana    Comment: Daily    Allergies  Allergen Reactions   Penicillins Hives    Current Facility-Administered Medications  Medication Dose Route Frequency  Provider Last Rate Last Admin   acetaminophen (TYLENOL) tablet 650 mg  650 mg Oral Q6H PRN Athena Masse, MD       Or   acetaminophen (TYLENOL) suppository 650 mg  650 mg Rectal Q6H PRN Athena Masse, MD       atorvastatin (LIPITOR) tablet 40 mg  40 mg Oral QPM Athena Masse, MD       buPROPion (WELLBUTRIN XL) 24 hr tablet 300 mg  300 mg Oral Daily Athena Masse, MD       carbamazepine (TEGRETOL XR) 12 hr tablet 200 mg  200 mg Oral BID Athena Masse, MD       Chlorhexidine Gluconate Cloth 2 % PADS 6 each  6 each Topical Q0600 Iram Lundberg F, MD       levothyroxine (SYNTHROID) tablet 50 mcg  50 mcg Oral Q0600 Athena Masse, MD   50 mcg at 07/24/22 0625   morphine (PF) 2 MG/ML injection 2 mg  2 mg Intravenous Q2H PRN Athena Masse, MD       ondansetron [facility]) tablet 4 mg  4 mg Oral Q6H PRN Athena Masse, MD       Or   ondansetron [facility]) injection 4 mg  4 mg Intravenous Q6H PRN Athena Masse, MD  pantoprazole (PROTONIX) EC tablet 40 mg  40 mg Oral Daily Athena Masse, MD       vancomycin (VANCOREADY) IVPB 1250 mg/250 mL  1,250 mg Intravenous Q12H Athena Masse, MD       Current Outpatient Medications  Medication Sig Dispense Refill   acetaminophen (TYLENOL) 500 MG tablet Take 500-1,000 mg by mouth every 8 (eight) hours as needed for headache.     atorvastatin (LIPITOR) 40 MG tablet Take 40 mg by mouth every evening.     budesonide-formoterol (SYMBICORT) 160-4.5 MCG/ACT inhaler Inhale 2 puffs into the lungs 2 (two) times daily as needed (Shortness of breath).     buPROPion (WELLBUTRIN XL) 300 MG 24 hr tablet Take 300 mg by mouth daily.     Clindamycin-Benzoyl Per, Refr, gel Apply 1 Application topically daily as needed (abscess).     levothyroxine (SYNTHROID) 50 MCG tablet Take 50 mcg by mouth daily before breakfast.     mupirocin ointment (BACTROBAN) 2 % Apply 1 Application topically daily as needed (Abscess).     pantoprazole (PROTONIX) 40 MG tablet Take 40 mg by  mouth daily.     sulfamethoxazole-trimethoprim (BACTRIM DS) 800-160 MG tablet Take 1 tablet by mouth 2 (two) times daily.     TEGRETOL-XR 200 MG 12 hr tablet Take 200 mg by mouth 2 (two) times daily.     varenicline (CHANTIX) 1 MG tablet Take 1 mg by mouth as directed.     Vitamin D, Ergocalciferol, (DRISDOL) 1.25 MG (50000 UNIT) CAPS capsule Take 50,000 Units by mouth once a week.     oxyCODONE (ROXICODONE) 5 MG immediate release tablet Take 1-2 tablets by mouth every 4 hours as needed for pain (Patient not taking: Reported on 06/10/2022) 30 tablet 0     Review of Systems Full ROS  was asked and was negative except for the information on the HPI  Physical Exam Blood pressure 124/66, pulse 81, temperature 97.7 F (36.5 C), temperature source Oral, resp. rate 13, height 5\' 11"  (1.803 m), weight 126.5 kg, SpO2 97 %. CONSTITUTIONAL: NAD. EYES: Pupils are equal, round, and reactive Sclera are non-icteric. EARS, NOSE, MOUTH AND THROAT: The oropharynx is clear. The oral mucosa is pink and moist. Hearing is intact to voice. LYMPH NODES:  Lymph nodes in the neck are normal. RESPIRATORY:  Lungs are clear. There is normal respiratory effort, with equal breath sounds bilaterally, and without pathologic use of accessory muscles. CARDIOVASCULAR: Heart is regular without murmurs, gallops, or rubs. GI: The abdomen is  soft, nontender, and nondistended. There are no palpable masses. There is no hepatosplenomegaly. There are normal bowel sounds in all quadrants. Rectal: There is evidence of fluctuance and induration over the posterior aspect of the perirectal area.  It is exquisitely tender to palpation.  There is no evidence of necrotizing infection.  There is no evidence of masses in the rectum MUSCULOSKELETAL: His right leg is wrapped SKIN: Turgor is good and there are no pathologic skin lesions or ulcers. NEUROLOGIC: Motor and sensation is grossly normal. Cranial nerves are grossly intact. PSYCH:   Oriented to person, place and time. Affect is normal.  Data Reviewed  I have personally reviewed the patient's imaging, laboratory findings and medical records.    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  Discussed with patient detail that currently he is get an abscess but likely he will develop a perianal fistula given the chronicity.  Do recommend proceeding with incision and drainage.  I  do think that this could be potentially done here in the ER with the support of emergency physician to provide conscious sedation.  I discussed with the patient in detail and he is agreed in agreement.  Please note that I spent 75 minutes in this encounter including personally reviewing imaging studies, coordinating his care, placing orders and performing appropriate mentation Appreciate Dr. Jodi Mourning assistance with conscious sedation  PROCEDURE NOTE  Incision and drainage of posterior perirectal abscess Debridement sub q and muscle 3cm2  Anesthesia: MAC Sedation provided by Dr. Jacelyn Grip + lidocaine 1% w  qpi 20cc  Findings: Posterior perirectal abscess  EBL: minimal  Complications: none  After informed consent was obtained the patient was placed in a lateral decubitus position.  ER physician provided conscious sedation the area was prepped and draped in the usual sterile fashion and lidocaine 1% with epi was injected over the area of interest.  Using 11 blade knife we created an elliptical incision pus was drained.  This was appropriately cultured.  We were able to break down all loculations.  This was located in the posterior aspect of the perirectal area.  It did not communicate with the rectum. Movement was performed in an excisional fashion with Metzen[place]ums.  No evidence of necrotizing infection was seen. Hemostasis was obtained with pressure.  Iodoform packing was placed and a dry gauze was placed in the standard fashion.  No complications      Caroleen Hamman, MD FACS General Surgeon 07/24/2022, 12:43 PM

## 2022-07-24 NOTE — ED Notes (Signed)
Patient transported to CT 

## 2022-07-24 NOTE — Assessment & Plan Note (Signed)
Vancomycin and Rocephin

## 2022-07-24 NOTE — Assessment & Plan Note (Signed)
Previously followed by dermatology Uncertain whether perianal abscess is hidradenitis lesion.  Will defer to surgery

## 2022-07-24 NOTE — ED Notes (Signed)
Pt returned from CT °

## 2022-07-24 NOTE — ED Notes (Signed)
Pt. Sleeping, chest rise and fall, NAD. °

## 2022-07-24 NOTE — Assessment & Plan Note (Signed)
Continue levothyroxine 

## 2022-07-24 NOTE — ED Notes (Signed)
Advised nurse that patient has ready bed 

## 2022-07-24 NOTE — Assessment & Plan Note (Signed)
Continue atorvastatin

## 2022-07-24 NOTE — Sedation Documentation (Signed)
Dr. Dahlia Byes verbalizes completion of procedure. Pt's incision packed and dressed by dr. Dahlia Byes. Pt. Continues to be chatty, conversational and very awake.

## 2022-07-24 NOTE — ED Notes (Signed)
This RN to bedside to check on pt. Pt. Denies pain, or need currently. Pt. States he would just like to rest, this RN tells pt. She will be back in a bit to medicate and check pt's VS, pt. Agrees. Urinal emptied. Bed locked, in low position. Call light within reach.

## 2022-07-24 NOTE — Assessment & Plan Note (Signed)
Continue Tegretol 

## 2022-07-24 NOTE — ED Provider Notes (Signed)
[facility]  PROCEDURES:   .Sedation  Date/Time: 07/24/2022 12:04 PM  Performed by: Lucillie Garfinkel, MD Authorized by: Lucillie Garfinkel, MD   Consent:    Consent obtained:  Verbal   Consent given by:  Patient   Risks discussed:  Allergic reaction, dysrhythmia, inadequate sedation, nausea, prolonged hypoxia resulting in organ damage, vomiting, respiratory compromise necessitating ventilatory assistance and intubation and prolonged sedation necessitating reversal   Alternatives discussed:  Analgesia without sedation and anxiolysis Universal protocol:    Immediately prior to procedure, a time out was called: yes     Patient identity confirmed:  Arm band and verbally with patient Indications:    Procedure performed:  Incision and drainage   Procedure necessitating sedation performed by:  Different physician Pre-sedation assessment:    Time since last food or drink:  Morning   ASA classification: class 2 - patient with mild systemic disease     Mouth opening:  3 or more finger widths   Thyromental distance:  3 finger widths   Mallampati score:  I - soft palate, uvula, fauces, pillars visible   Neck mobility: normal     Pre-sedation assessments completed and reviewed: pre-procedure airway patency not reviewed, pre-procedure cardiovascular function not reviewed, pre-procedure mental status not reviewed, pre-procedure nausea and vomiting status not reviewed and pre-procedure respiratory function not reviewed   Immediate pre-procedure details:    Reassessment: Patient reassessed immediately prior to procedure     Reviewed: vital signs and relevant labs/tests     Verified: bag valve mask available, emergency equipment available, intubation equipment available, IV patency confirmed, oxygen available and suction available   Procedure details (see MAR for exact dosages):    Preoxygenation:  Nasal cannula   Sedation:  Propofol   Intended level of sedation: moderate (conscious  sedation)   Analgesia:  Fentanyl   Intra-procedure monitoring:  Blood pressure monitoring, continuous capnometry, frequent LOC assessments, frequent vital sign checks, continuous pulse oximetry and cardiac monitor   Intra-procedure events: none     Total Provider sedation time (minutes):  20    MEDICATIONS ORDERED IN ED: Medications  atorvastatin (LIPITOR) tablet 40 mg (has no administration in time range)  buPROPion (WELLBUTRIN XL) 24 hr tablet 300 mg (0 mg Oral Hold 07/24/22 1030)  levothyroxine (SYNTHROID) tablet 50 mcg (50 mcg Oral Given 07/24/22 0625)  pantoprazole (PROTONIX) EC tablet 40 mg (has no administration in time range)  carbamazepine (TEGRETOL XR) 12 hr tablet 200 mg (has no administration in time range)  acetaminophen (TYLENOL) tablet 650 mg (has no administration in time range)    Or  acetaminophen (TYLENOL) suppository 650 mg (has no administration in time range)  ondansetron (ZOFRAN) tablet 4 mg (has no administration in time range)    Or  ondansetron (ZOFRAN) injection 4 mg (has no administration in time range)  0.9 %  sodium chloride infusion ( Intravenous New Bag/Given 07/24/22 0445)  morphine (PF) 2 MG/ML injection 2 mg (has no administration in time range)  vancomycin (VANCOREADY) IVPB 1250 mg/250 mL (has no administration in time range)  Chlorhexidine Gluconate Cloth 2 % PADS 6 each (has no administration in time range)  propofol (DIPRIVAN) 10 mg/mL bolus/IV push (25 mg Intravenous Given 07/24/22 1152)  iohexol (OMNIPAQUE) 350 MG/ML injection 100 mL (100 mLs Intravenous Contrast Given 07/24/22 0208)  metroNIDAZOLE (FLAGYL) IVPB 500 mg (0 mg Intravenous Stopped 07/24/22 0425)  cefTRIAXone (ROCEPHIN) 2 g in sodium chloride 0.9 % 100 mL IVPB (0 g Intravenous Stopped 07/24/22 0425)  vancomycin (VANCOCIN) IVPB 1000 mg/200 mL premix (0 mg Intravenous Stopped 07/24/22 0552)    Followed by  vancomycin (VANCOREADY) IVPB 1500 mg/300 mL (0 mg Intravenous Stopped 07/24/22 0815)   lidocaine-EPINEPHrine (XYLOCAINE W/EPI) 1 %-1:100000 (with pres) injection 20 mL (10 mLs Infiltration Given by Other 07/24/22 1143)  fentaNYL (SUBLIMAZE) injection 100 mcg (100 mcg Intravenous Given 07/24/22 1142)  sodium chloride 0.9 % bolus 1,000 mL (1,000 mLs Intravenous New Bag/Given 07/24/22 1130)  propofol (DIPRIVAN) 10 mg/mL bolus/IV push 50 mg (50 mg Intravenous Given 07/24/22 1142)  propofol (DIPRIVAN) 10 mg/mL bolus/IV push 50 mg (50 mg Intravenous Given 07/24/22 1140)      Lucillie Garfinkel, MD 07/24/22 1206

## 2022-07-24 NOTE — Sedation Documentation (Signed)
Pt. Remains awake, and drowsy, speaking rapidly, grimmacing and yelling with pain while dr. Dahlia Byes makes incisions.

## 2022-07-24 NOTE — Progress Notes (Signed)
Pharmacy Antibiotic Note  Brian Cole is a 37 y.o. male admitted on 07/24/2022 with cellulitis.  Pharmacy has been consulted for Vancomycin dosing.  Plan: Vancomycin 2500 mg IV X 1 ordered for 3/16 @ ~ 0500. Vancomycin 1250 mg IV Q12H ordered to start on 3/16 @ ~ 1700.  AUC = 491.9 Vanc trough = 13.7   Height: 5\' 11"  (180.3 cm) Weight: 126.5 kg (278 lb 14.1 oz) IBW/kg (Calculated) : 75.3  Temp (24hrs), Avg:98.1 F (36.7 C), Min:98.1 F (36.7 C), Max:98.1 F (36.7 C)  Recent Labs  Lab 07/24/22 0057 07/24/22 0241  WBC 16.3*  --   CREATININE 1.20  --   LATICACIDVEN 1.4 1.0    Estimated Creatinine Clearance: 116.4 mL/min (by C-G formula based on SCr of 1.2 mg/dL).    Allergies  Allergen Reactions   Penicillins Hives    Antimicrobials this admission:   >>    >>   Dose adjustments this admission:   Microbiology results:  BCx:   UCx:    Sputum:    MRSA PCR:   Thank you for allowing pharmacy to be a part of this patient's care.  Lorcan Shelp D 07/24/2022 4:49 AM

## 2022-07-24 NOTE — Sedation Documentation (Signed)
Pt. Is still awake and talkative, Dr. Jacelyn Grip and dr. Dahlia Byes await sedation. Pt. States that he has a certain tolerance for pain medications because he has smoked weed.

## 2022-07-24 NOTE — ED Triage Notes (Addendum)
Pt presents ambulatory to triage via POV with complaints of a rectal abscess that he believes has "ruptured". Pt endorses significant pain to his rear end and has some drainage and odor. He states having a low grade fever at home. No meds taken PTA. A&Ox4 at this time. Denies CP or SOB.

## 2022-07-24 NOTE — H&P (Signed)
History and Physical    Patient: Brian Cole [id number] DOB: 10-28-1985 DOA: 07/24/2022 DOS: the patient was seen and examined on 07/24/2022 PCP: Cyndi Bender, PA-C  Patient coming from: Home  Chief Complaint:  Chief Complaint  Patient presents with   Abscess    HPI: Brian Cole is a 37 y.o. 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  The abscess ruptured today and started draining pus followed by a large amount of blood.  He was able to pack it and then later on in the day he remove the packing and he states it started draining a large amount of blood so he repacked it and presented to the emergency room.  He states that the abscess has recurred in the same place for the past 2 years.  He denies fevers or chills.  He denies lightheadedness, chest pain or shortness of breath. ED course and data review: Vitals within normal limits.  WBC 16,300 and hemoglobin 13.8.  Lactic acid 1.0 other labs unremarkable.  CT abdomen and pelvis significant for the following:"Gas containing collection at the inferior aspect of the intergluteal cleft, consistent with perianal abscess"  Patient was started on ceftriaxone and Flagyl and hospitalist consulted for admission.      Past Medical History:  Diagnosis Date   Bipolar disorder ([place])    Depression    History of kidney stones    Murmur    Thyroid disease    Urgency of urination    Past Surgical History:  Procedure Laterality Date   OPEN REDUCTION, INTERNAL FIXATION (ORIF) CALCANEAL FRACTURE WITH FUSION Right 03/11/2022   Procedure: OPEN TREATMENT RIGHT CALCANEUS FRACTURE;  Surgeon: Erle Crocker, MD;  Location: [facility];  Service: Orthopedics;  Laterality: Right;  LENGTH  OF SURGERY: 120 MINUTES   TENOLYSIS Right 03/11/2022   Procedure: RIGHT PERONEAL TENDON DEBRIDEMENT;  Surgeon: Erle Crocker, MD;  Location: [place];  Service: Orthopedics;  Laterality: Right;   WISDOM TOOTH EXTRACTION     23 teeth removed   Social History:  reports that he has been smoking cigarettes. He has been smoking an average of .25 packs per day. He has never used smokeless tobacco. He reports that he does not currently use alcohol. He reports current drug use. Drug: Marijuana.  Allergies  Allergen Reactions   Penicillins Hives    History reviewed. No pertinent family history.  Prior to Admission medications   Medication Sig Start Date End Date Taking? Authorizing Provider  atorvastatin (LIPITOR) 40 MG tablet Take 40 mg by mouth every evening. 02/10/22   [provider]  budesonide-formoterol (SYMBICORT) 160-4.5 MCG/ACT inhaler Inhale 2 puffs into the lungs 2 (two) times daily as needed (Shortness of breath).    [provider]  buPROPion (WELLBUTRIN XL) 300 MG 24 hr tablet Take 300 mg by mouth daily. 02/10/22   [provider]  Clindamycin-Benzoyl Per, Refr, gel Apply 1 Application topically daily as needed (abscess). 01/12/22   [provider]  DOXYCYCLINE HYCLATE PO Take 100 mg by mouth daily. Patient not taking: Reported on 06/10/2022    [provider]  levothyroxine (SYNTHROID) 50 MCG tablet Take 50 mcg by mouth daily before breakfast.    [provider]  mupirocin ointment (BACTROBAN) 2 % Apply 1  Application topically daily as needed (Abscess). 09/15/21   [provider]  oxyCODONE (OXY IR/ROXICODONE) 5 MG immediate release tablet Take 5 mg by mouth every 6 (six) hours as needed for pain. Patient not taking: Reported on 06/10/2022 03/05/22   [provider]  oxyCODONE (ROXICODONE) 5 MG immediate release tablet Take 1-2 tablets by mouth every 4 hours as needed for pain Patient not taking: Reported on 06/10/2022  03/11/22   [place], Jesse J, PA-C  pantoprazole (PROTONIX) 40 MG tablet Take 40 mg by mouth daily. 02/10/22   [provider]  TEGRETOL-XR 200 MG 12 hr tablet Take 200 mg by mouth 2 (two) times daily. 02/10/22   [provider]  Vitamin D, Ergocalciferol, (DRISDOL) 1.25 MG (50000 UNIT) CAPS capsule Take 50,000 Units by mouth once a week. 02/10/22   [provider]    Physical Exam: Vitals:   07/24/22 0019 07/24/22 0029 07/24/22 0049 07/24/22 0100  BP:  (!) 146/84  (!) 141/81  Pulse:  91  92  Resp:  18  18  Temp:  98.1 F (36.7 C)    TempSrc:  Oral    SpO2:  98%  97%  Weight: 118.6 kg  126.5 kg   Height: 5\' 11"  (1.803 m)  5\' 11"  (1.803 m)    Physical Exam Vitals and nursing note reviewed.  Constitutional:      General: He is not in acute distress. HENT:     Head: Normocephalic and atraumatic.  Cardiovascular:     Rate and Rhythm: Normal rate and regular rhythm.     Heart sounds: Normal heart sounds.  Pulmonary:     Effort: Pulmonary effort is normal.     Breath sounds: Normal breath sounds.  Abdominal:     Palpations: Abdomen is soft.     Tenderness: There is no abdominal tenderness.  Musculoskeletal:     Comments: Right ankle in bandage  Neurological:     Mental Status: Mental status is at baseline.     Labs on Admission: I have personally reviewed following labs and imaging studies  CBC: Recent Labs  Lab 07/24/22 0057  WBC 16.3*  NEUTROABS 12.2*  HGB 13.8  HCT 40.9  MCV 103.5*  PLT [id number]   Basic Metabolic Panel: Recent Labs  Lab 07/24/22 0057  NA 135  K 3.6  CL 101  CO2 26  GLUCOSE 114*  BUN 9  CREATININE 1.20  CALCIUM 9.1   GFR: Estimated Creatinine Clearance: 116.4 mL/min (by C-G formula based on SCr of 1.2 mg/dL). Liver Function Tests: Recent Labs  Lab 07/24/22 0057  AST 18  ALT 16  ALKPHOS 92  BILITOT 0.5  PROT 7.8  ALBUMIN 3.9   No results for input(s): "LIPASE", "AMYLASE" in the last 168 hours. No results for  input(s): "AMMONIA" in the last 168 hours. Coagulation Profile: No results for input(s): "INR", "PROTIME" in the last 168 hours. Cardiac Enzymes: No results for input(s): "CKTOTAL", "CKMB", "CKMBINDEX", "TROPONINI" in the last 168 hours. BNP (last 3 results) No results for input(s): "PROBNP" in the last 8760 hours. HbA1C: No results for input(s): "HGBA1C" in the last 72 hours. CBG: No results for input(s): "GLUCAP" in the last 168 hours. Lipid Profile: No results for input(s): "CHOL", "HDL", "LDLCALC", "TRIG", "CHOLHDL", "LDLDIRECT" in the last 72 hours. Thyroid Function Tests: No results for input(s): "TSH", "T4TOTAL", "FREET4", "T3FREE", "THYROIDAB" in the last 72 hours. Anemia Panel: No results for input(s): "VITAMINB12", "FOLATE", "FERRITIN", "TIBC", "IRON", "RETICCTPCT" in the last 72  hours. Urine analysis:    Component Value Date/Time   COLORURINE YELLOW (A) 10/25/2017 2034   APPEARANCEUR CLEAR (A) 10/25/2017 2034   APPEARANCEUR Clear 08/07/2013 2025   LABSPEC 1.009 10/25/2017 2034   LABSPEC 1.016 08/07/2013 2025   PHURINE 6.0 10/25/2017 2034   GLUCOSEU NEGATIVE 10/25/2017 2034   GLUCOSEU Negative 08/07/2013 2025   HGBUR NEGATIVE 10/25/2017 2034   BILIRUBINUR NEGATIVE 10/25/2017 2034   BILIRUBINUR Negative 08/07/2013 2025   KETONESUR NEGATIVE 10/25/2017 2034   PROTEINUR NEGATIVE 10/25/2017 2034   NITRITE NEGATIVE 10/25/2017 2034   LEUKOCYTESUR NEGATIVE 10/25/2017 2034   LEUKOCYTESUR Negative 08/07/2013 2025    Radiological Exams on Admission: CT ABDOMEN PELVIS W CONTRAST  Result Date: 07/24/2022 CLINICAL DATA:  Perianal inflammation EXAM: CT ABDOMEN AND PELVIS WITH CONTRAST TECHNIQUE: Multidetector CT imaging of the abdomen and pelvis was performed using the standard protocol following bolus administration of intravenous contrast. RADIATION DOSE REDUCTION: This exam was performed according to the departmental dose-optimization program which includes automated exposure  control, adjustment of the mA and/or kV according to patient size and/or use of iterative reconstruction technique. CONTRAST:  115mL OMNIPAQUE IOHEXOL 350 MG/ML SOLN COMPARISON:  02/13/2022 FINDINGS: Lower Chest: Normal. Hepatobiliary: Normal hepatic contours. No intra- or extrahepatic biliary dilatation. The gallbladder is normal. Pancreas: Normal pancreas. No ductal dilatation or peripancreatic fluid collection. Spleen: Normal. Adrenals/Urinary Tract: The adrenal glands are normal. No hydronephrosis, nephroureterolithiasis or solid renal mass. The urinary bladder is normal for degree of distention Stomach/Bowel: There is no hiatal hernia. Normal duodenal course and caliber. No small bowel dilatation or inflammation. No focal colonic abnormality. Normal appendix. Vascular/Lymphatic: Normal course and caliber of the major abdominal vessels. No abdominal or pelvic lymphadenopathy. Reproductive: Normal prostate size with symmetric seminal vesicles. Other: At the inferior aspect of the intergluteal cleft, there is a gas containing collection that extends to the skin surface. There is surrounding inflammatory change. The collection measures approximately 3.5 x 1.3 x 1.3 cm. Musculoskeletal: No bony spinal canal stenosis or focal osseous abnormality. IMPRESSION: Gas containing collection at the inferior aspect of the intergluteal cleft, consistent with perianal abscess. Electronically Signed   By: Ulyses Jarred M.D.   On: 07/24/2022 02:27     Data Reviewed: Relevant notes from primary care and specialist visits, past discharge summaries as available in EHR, including Care Everywhere. Prior diagnostic testing as pertinent to current admission diagnoses Updated medications and problem lists for reconciliation ED course, including vitals, labs, imaging, treatment and response to treatment Triage notes, nursing and pharmacy notes and ED provider's notes Notable results as noted in HPI   Assessment and Plan: *  Perianal abscess Vancomycin and Rocephin Pain control Keep n.p.o. in case of procedure Serial H&H as patient reports heavy bleeding from abscess Surgical consult  Hidradenitis suppurativa Previously followed by dermatology Uncertain whether perianal abscess is hidradenitis lesion.  Will defer to surgery  HLD (hyperlipidemia) Continue atorvastatin  Bipolar disorder (HCC) Continue Tegretol  Hypothyroidism Continue levothyroxine        DVT prophylaxis: [place] D  Consults: Surgery, Dr. Dahlia Byes  Advance Care Planning: full code  Family Communication: none  Disposition Plan: Back to previous home environment  Severity of Illness: The appropriate patient status for this patient is INPATIENT. Inpatient status is judged to be reasonable and necessary in order to provide the required intensity of service to ensure the patient's safety. The patient's presenting symptoms, physical exam findings, and initial radiographic and laboratory data in the context of their chronic comorbidities is  felt to place them at high risk for further clinical deterioration. Furthermore, it is not anticipated that the patient will be medically stable for discharge from the hospital within 2 midnights of admission.   * I certify that at the point of admission it is my clinical judgment that the patient will require inpatient hospital care spanning beyond 2 midnights from the point of admission due to high intensity of service, high risk for further deterioration and high frequency of surveillance required.*  Author: Athena Masse, MD 07/24/2022 4:15 AM  For on call review [email or web address].

## 2022-07-24 NOTE — ED Provider Notes (Signed)
[facility] Provider Note    Event Date/Time   First MD Initiated Contact with Patient 07/24/22 [phone]     (approximate)   History   Abscess   HPI  Brian Cole is a 37 y.o. male who presents for evaluation of possible abscess around his anus.  He said that he gets these quite frequently.  Normally they "pop" on their own and drained for a while and then get better.  However this 1 has not improved and has, in fact, gotten worse over the last few days.  He said that there was an extensive area of hard and swollen skin tracking up the buttock with a softer and bulging area closer to the anus.  Today he the soft bulging area "popped" and a bunch of pus came out.  He said that the surrounding swelling went down even though there is still some firmness and tenderness to palpation.  However, the area will not stop bleeding.  He has has placed some cotton balls between his buttocks which seems to stop the bleeding but he is concerned about the persistence of the blood and the size of the area involved.  He also said he has subjective fever and he is just not felt quite right recently.  No nausea or vomiting.  No chest pain or shortness of breath.     Physical Exam   Triage Vital Signs: ED Triage Vitals  Enc Vitals Group     BP 07/24/22 0029 (!) 146/84     Pulse Rate 07/24/22 0029 91     Resp 07/24/22 0029 18     Temp 07/24/22 0029 98.1 F (36.7 C)     Temp Source 07/24/22 0029 Oral     SpO2 07/24/22 0029 98 %     Weight 07/24/22 0019 118.6 kg (261 lb 7.5 oz)     Height 07/24/22 0019 1.803 m (5\' 11" )     Head Circumference --      Peak Flow --      Pain Score 07/24/22 0019 9     Pain Loc --      Pain Edu? --      Excl. in [place]? --     Most recent vital signs: Vitals:   07/24/22 0100 07/24/22 0454  BP: (!) 141/81 103/65  Pulse: 92 78  Resp: 18 18  Temp:  97.9 F (36.6 C)  SpO2: 97% 95%     General: Awake, not in severe distress at rest.  Morbid  obesity. CV:  Good peripheral perfusion.  Regular rate and rhythm although borderline tachycardia initially. Resp:  Normal effort. Speaking easily and comfortably, no accessory muscle usage nor intercostal retractions.   Abd:  No distention.  No tenderness to palpation of the abdomen. Rectal:  The patient has large areas of induration along the inner part of his left buttock as well as up towards the natal cleft.  There is what appears to be an abscess or fluid collection closer to the anus although it does not seem to be tracking into the anus or rectum.  It is exquisitely tender to palpation.  When I removed the cotton balls, it immediately started draining a significant amount of blood from 2 separate open areas on the abscess.  No purulent material was evident at this time.  There is surrounding fluctuance, and again there is induration tracking closer to the anus and up away from the anus onto the buttock.   ED Results /  Procedures / Treatments   Labs (all labs ordered are listed, but only abnormal results are displayed) Labs Reviewed  CBC WITH DIFFERENTIAL/PLATELET - Abnormal; Notable for the following components:      Result Value   WBC 16.3 (*)    RBC 3.95 (*)    MCV 103.5 (*)    MCH 34.9 (*)    Neutro Abs 12.2 (*)    Monocytes Absolute 1.6 (*)    All other components within normal limits  COMPREHENSIVE METABOLIC PANEL - Abnormal; Notable for the following components:   Glucose, Bld 114 (*)    All other components within normal limits  LACTIC ACID, PLASMA  LACTIC ACID, PLASMA  HIV ANTIBODY (ROUTINE TESTING W REFLEX)  HEMOGLOBIN  HEMOGLOBIN  HEMOGLOBIN      RADIOLOGY I viewed and interpreted the patient's CT scan of the abdomen and pelvis.  He has a gas containing abscess that seems to be perianal with surrounding stranding and inflammation.  Radiologist confirmed perianal abscess rather than perirectal.  No other acute abnormalities  identified.    PROCEDURES:  Critical Care performed: No  Procedures   MEDICATIONS ORDERED IN ED: Medications  atorvastatin (LIPITOR) tablet 40 mg (has no administration in time range)  buPROPion (WELLBUTRIN XL) 24 hr tablet 300 mg (has no administration in time range)  levothyroxine (SYNTHROID) tablet 50 mcg (has no administration in time range)  pantoprazole (PROTONIX) EC tablet 40 mg (has no administration in time range)  carbamazepine (TEGRETOL XR) 12 hr tablet 200 mg (has no administration in time range)  acetaminophen (TYLENOL) tablet 650 mg (has no administration in time range)    Or  acetaminophen (TYLENOL) suppository 650 mg (has no administration in time range)  ondansetron (ZOFRAN) tablet 4 mg (has no administration in time range)    Or  ondansetron (ZOFRAN) injection 4 mg (has no administration in time range)  0.9 %  sodium chloride infusion ( Intravenous New Bag/Given 07/24/22 0445)  morphine (PF) 2 MG/ML injection 2 mg (has no administration in time range)  vancomycin (VANCOCIN) IVPB 1000 mg/200 mL premix (1,000 mg Intravenous New Bag/Given 07/24/22 0452)    Followed by  vancomycin (VANCOREADY) IVPB 1500 mg/300 mL (has no administration in time range)  vancomycin (VANCOREADY) IVPB 1250 mg/250 mL (has no administration in time range)  iohexol (OMNIPAQUE) 350 MG/ML injection 100 mL (100 mLs Intravenous Contrast Given 07/24/22 0208)  metroNIDAZOLE (FLAGYL) IVPB 500 mg (0 mg Intravenous Stopped 07/24/22 0425)  cefTRIAXone (ROCEPHIN) 2 g in sodium chloride 0.9 % 100 mL IVPB (0 g Intravenous Stopped 07/24/22 0425)     IMPRESSION / MDM / ASSESSMENT AND PLAN / ED COURSE  I reviewed the triage vital signs and the nursing notes.                              Differential diagnosis includes, but is not limited to, perianal versus perirectal abscess, hydradenitis suppurativa, cellulitis, necrotizing fasciitis.  Patient's presentation is most consistent with acute presentation  with potential threat to life or bodily function.  Labs/studies ordered: CBC with differential, lactic acid, CMP, CT of the abdomen and pelvis with IV contrast Interventions/Medications given: Ceftriaxone 2 g IV, metronidazole 500 mg IV [facility] Course my include additional interventions or labs/studies not listed above.)  Vital signs are stable although initially he was a bit tachycardic.  However he does not meet sepsis criteria given that his heart rate settled down below 90.  However  he has a leukocytosis of 16.3 but a normal lactic acid and normal CMP.  Given the history of hidradenitis suppurativa but the concerning findings on physical exam, I ordered a CT of the abdomen and pelvis.  This confirmed that he has a sizable abscess that is listed as perianal, but also has surrounding inflammation in addition to the palpable induration of his buttocks and his report that it was significantly worse earlier.  Given the complications of the situation including his history, his morbid obesity, his leukocytosis, the delicate area involved, etc., I feel the patient would be best served by aggressively treating with antibiotics.  I considered calling Dr. Dahlia Byes overnight, but I do not feel that there is an emergent surgical need.  I feel it would be appropriate to admit the patient with IV antibiotics and asked the hospitalist to consult Dr. Dahlia Byes or one of his colleagues during the daytime hours.   The surgeon can decide at that time whether the patient would benefit from bedside I&D, or perhaps more likely, I&D in the operating room.  I discussed all of this with the patient and he agrees with the plan is that he would feel much more comfortable staying as well.  I ordered ceftriaxone 2 g IV and metronidazole 500 mg IV.  The patient has a distant history of rash to penicillins, but he said it only happened once when he was an infant.  I put in a pharmacy consult if they feel like it would be appropriate to  switch it, but I am comfortable with this plan.  Consulting hospitalist for admission.   Clinical Course as of 07/24/22 0537  Sat Jul 24, 2022  0327 Discussed case in person with Dr. Damita Dunnings with the hospitalist service.  She will admit the patient for further management and likely consult surgery in the morning. [CF]    Clinical Course User Index [CF] Hinda Kehr, MD     FINAL CLINICAL IMPRESSION(S) / ED DIAGNOSES   Final diagnoses:  Perianal abscess  Cellulitis and abscess of buttock     Rx / DC Orders   ED Discharge Orders     None        Note:  This document was prepared using Dragon voice recognition software and may include unintentional dictation errors.   Hinda Kehr, MD 07/24/22 [phone]

## 2022-07-24 NOTE — Progress Notes (Signed)
PHARMACY -  BRIEF ANTIBIOTIC NOTE   Pharmacy has received consult(s) for Ceftriaxone, metronidazole from an ED provider.  The patient's profile has been reviewed for ht/wt/allergies/indication/available labs.    One time order(s) placed for Ceftriaxone 2 gm IV X 1 and metronidazole 500 mg IV X 1  Further antibiotics/pharmacy consults should be ordered by admitting physician if indicated.                       Thank you, Melaina Howerton D 07/24/2022  3:18 AM

## 2022-07-24 NOTE — Assessment & Plan Note (Addendum)
Vancomycin and Rocephin Pain control Keep n.p.o. in case of procedure Serial H&H as patient reports heavy bleeding from abscess Surgical consult

## 2022-07-25 LAB — URINE DRUG SCREEN, QUALITATIVE (ARMC ONLY)
Amphetamines, Ur Screen: NOT DETECTED
Barbiturates, Ur Screen: NOT DETECTED
Benzodiazepine, Ur Scrn: NOT DETECTED
Cannabinoid 50 Ng, Ur ~~LOC~~: POSITIVE — AB
Cocaine Metabolite,Ur ~~LOC~~: NOT DETECTED
MDMA (Ecstasy)Ur Screen: NOT DETECTED
Methadone Scn, Ur: NOT DETECTED
Opiate, Ur Screen: POSITIVE — AB
Phencyclidine (PCP) Ur S: NOT DETECTED
Tricyclic, Ur Screen: NOT DETECTED

## 2022-07-25 LAB — COMPREHENSIVE METABOLIC PANEL
ALT: 14 U/L (ref 0–44)
AST: 13 U/L — ABNORMAL LOW (ref 15–41)
Albumin: 3.2 g/dL — ABNORMAL LOW (ref 3.5–5.0)
Alkaline Phosphatase: 79 U/L (ref 38–126)
Anion gap: 7 (ref 5–15)
BUN: 7 mg/dL (ref 6–20)
CO2: 22 mmol/L (ref 22–32)
Calcium: 8.4 mg/dL — ABNORMAL LOW (ref 8.9–10.3)
Chloride: 106 mmol/L (ref 98–111)
Creatinine, Ser: 0.85 mg/dL (ref 0.61–1.24)
GFR, Estimated: >60 mL/min (ref >60)
Glucose, Bld: 104 mg/dL — ABNORMAL HIGH (ref 70–99)
Potassium: 3.5 mmol/L (ref 3.5–5.1)
Sodium: 135 mmol/L (ref 135–145)
Total Bilirubin: 0.7 mg/dL (ref 0.3–1.2)
Total Protein: 6.8 g/dL (ref 6.5–8.1)

## 2022-07-25 LAB — CBC
HCT: 38.5 % — ABNORMAL LOW (ref 39.0–52.0)
Hemoglobin: 13 g/dL (ref 13.0–17.0)
MCH: 35 pg — ABNORMAL HIGH (ref 26.0–34.0)
MCHC: 33.8 g/dL (ref 30.0–36.0)
MCV: 103.8 fL — ABNORMAL HIGH (ref 80.0–100.0)
Platelets: 290 10*3/uL (ref 150–400)
RBC: 3.71 MIL/uL — ABNORMAL LOW (ref 4.22–5.81)
RDW: 12.3 % (ref 11.5–15.5)
WBC: 8.5 10*3/uL (ref 4.0–10.5)
nRBC: 0 % (ref 0.0–0.2)

## 2022-07-25 MED ORDER — HYDROCODONE-ACETAMINOPHEN 5-325 MG PO TABS: 1.0000 | ORAL_TABLET | Freq: Four times a day (QID) | ORAL | 0 refills | Status: DC | PRN

## 2022-07-25 MED ORDER — CIPROFLOXACIN HCL 500 MG PO TABS: 500.0000 mg | ORAL_TABLET | Freq: Two times a day (BID) | ORAL | Status: DC

## 2022-07-25 MED ORDER — METRONIDAZOLE 500 MG PO TABS: 500.0000 mg | ORAL_TABLET | Freq: Three times a day (TID) | ORAL | 0 refills | Status: AC

## 2022-07-25 MED ORDER — METRONIDAZOLE 500 MG PO TABS: 500.0000 mg | ORAL_TABLET | Freq: Three times a day (TID) | ORAL | Status: DC

## 2022-07-25 MED ORDER — CIPROFLOXACIN HCL 500 MG PO TABS: 500.0000 mg | ORAL_TABLET | Freq: Two times a day (BID) | ORAL | 0 refills | Status: AC

## 2022-07-25 NOTE — Plan of Care (Signed)
Patient is adequate for discharge from this unit to prior living environment with family support.  PIV removed and instructions provided with patient reports understanding.

## 2022-07-25 NOTE — Discharge Summary (Signed)
Physician Discharge Summary   Patient: Brian Cole MRN: [id number] DOB: 22-Feb-1986  Admit date:     07/24/2022  Discharge date: 07/25/22  Discharge Physician: Oran Rein   PCP: Cyndi Bender, PA-C   Recommendations at discharge:  {Tip this will not be part of the note when signed- Example include specific recommendations for outpatient follow-up, pending tests to follow-up on. (Optional):26781}  Follow up with surgery in a week   Discharge Diagnoses: Principal Problem:   Perianal abscess Active Problems:   Hidradenitis suppurativa   Hypothyroidism   Bipolar disorder ([place])   HLD (hyperlipidemia)   Perirectal abscess  Resolved Problems:   * No resolved hospital problems. *  Hospital Course:  Brian Cole is a 37 y.o. 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  The abscess ruptured today and started draining pus followed by a large amount of blood.  He was able to pack it and then later on in the day he remove the packing and he states it started draining a large amount of blood so he repacked it and presented to the emergency room.  He states that the abscess has recurred in the same place for the past 2 years.  He denies fevers or chills.  He denies lightheadedness, chest pain or shortness of breath. ED course and data review: Vitals within normal limits.  WBC 16,300 and hemoglobin 13.8.  Lactic acid 1.0 other labs unremarkable.  CT abdomen and pelvis significant for the following:"Gas containing collection at the inferior aspect of the intergluteal cleft, consistent with perianal abscess"   Patient was started on ceftriaxone and Flagyl and hospitalist consulted for admission.   3/16 : Patient underwent  Incision and drainage of posterior perirectal abscess , Debridement sub q and muscle 3cm2   Assessment and Plan: * Perianal abscess Vancomycin and Rocephin and flagyl  Pain control Keep n.p.o. in case of procedure Serial H&H as patient reports heavy bleeding from abscess Surgical consult  Hidradenitis suppurativa Previously followed by dermatology Uncertain whether perianal abscess is hidradenitis lesion.  Will defer to surgery  HLD (hyperlipidemia) Continue atorvastatin  Bipolar disorder (HCC) Continue Tegretol  Hypothyroidism Continue levothyroxine   {Tip this will not be part of the note when signed Body mass index is 38.9 kg/m. , ,  (Optional):26781}  Pain control - [place] Controlled Substance Reporting System database was reviewed. and patient was instructed, not to drive, operate heavy machinery, perform activities at heights, swimming or participation in water activities or provide baby-sitting services while on Pain, Sleep and Anxiety Medications; until their outpatient Physician has advised to do so again. Also recommended to not to take more than prescribed Pain, Sleep and Anxiety Medications.  Consultants: Gen Surgery  Procedures performed: I/D of abscess  Disposition: Home Diet recommendation:  Discharge Diet Orders (From admission, onward)     Start     Ordered   07/25/22 0000  Diet - low sodium heart healthy        07/25/22 1208   07/25/22 0000  Diet - low sodium heart healthy        07/25/22 1431           Regular diet DISCHARGE MEDICATION: Allergies as of 07/25/2022       Reactions   Penicillins  Hives        Medication List     TAKE these medications    acetaminophen 500 MG tablet Commonly known as: TYLENOL Take 500-1,000 mg by mouth every 8 (eight) hours as needed for headache.   atorvastatin 40 MG tablet Commonly known as: LIPITOR Take 40 mg by mouth every evening.   budesonide-formoterol 160-4.5 MCG/ACT inhaler Commonly  known as: SYMBICORT Inhale 2 puffs into the lungs 2 (two) times daily as needed (Shortness of breath).   buPROPion 300 MG 24 hr tablet Commonly known as: WELLBUTRIN XL Take 300 mg by mouth daily.   ciprofloxacin 500 MG tablet Commonly known as: CIPRO Take 1 tablet (500 mg total) by mouth 2 (two) times daily for 7 days.   Clindamycin-Benzoyl Per (Refr) gel Apply 1 Application topically daily as needed (abscess).   HYDROcodone-acetaminophen 5-325 MG tablet Commonly known as: NORCO/VICODIN Take 1 tablet by mouth every 6 (six) hours as needed for moderate pain.   levothyroxine 50 MCG tablet Commonly known as: SYNTHROID Take 50 mcg by mouth daily before breakfast.   metroNIDAZOLE 500 MG tablet Commonly known as: FLAGYL Take 1 tablet (500 mg total) by mouth every 8 (eight) hours for 7 days.   mupirocin ointment 2 % Commonly known as: BACTROBAN Apply 1 Application topically daily as needed (Abscess).   oxyCODONE 5 MG immediate release tablet Commonly known as: Roxicodone Take 1-2 tablets by mouth every 4 hours as needed for pain   pantoprazole 40 MG tablet Commonly known as: PROTONIX Take 40 mg by mouth daily.   sulfamethoxazole-trimethoprim 800-160 MG tablet Commonly known as: BACTRIM DS Take 1 tablet by mouth 2 (two) times daily.   TEGretol-XR 200 MG 12 hr tablet Generic drug: carbamazepine Take 200 mg by mouth 2 (two) times daily.   varenicline 1 MG tablet Commonly known as: CHANTIX Take 1 mg by mouth as directed.   Vitamin D (Ergocalciferol) 1.25 MG (50000 UNIT) Caps capsule Commonly known as: DRISDOL Take 50,000 Units by mouth once a week.               Discharge Care Instructions  (From admission, onward)           Start     Ordered   07/25/22 0000  Change dressing (specify)       Comments: As described in the wound care orders   07/25/22 1431            Follow-up Information     Pabon, Marjory Lies, MD Follow up on 08/09/2022.   Specialty:  General Surgery Contact information: [address] [place] [address] [phone]                Discharge Exam: Danley Danker Weights   07/24/22 0019 07/24/22 0049  Weight: 118.6 kg 126.5 kg   Physical Exam Constitutional:      Appearance: Normal appearance.  HENT:     Head: Normocephalic and atraumatic.  Eyes:     Extraocular Movements: Extraocular movements intact.     Pupils: Pupils are equal, round, and reactive to light.  Cardiovascular:     Pulses: Normal pulses.  Pulmonary:     Effort: Pulmonary effort is normal.  Abdominal:     General: Abdomen is flat.     Palpations: Abdomen is soft.  Musculoskeletal:        General: Normal range of motion.     Cervical back: Normal range of motion.  Skin:    General: Skin is  warm.  Neurological:     General: No focal deficit present.     Mental Status: He is alert.  Psychiatric:        Mood and Affect: Mood normal.        Behavior: Behavior normal.        Thought Content: Thought content normal.      Condition at discharge: good  The results of significant diagnostics from this hospitalization (including imaging, microbiology, ancillary and laboratory) are listed below for reference.   Imaging Studies: CT ABDOMEN PELVIS W CONTRAST  Result Date: 07/24/2022 CLINICAL DATA:  Perianal inflammation EXAM: CT ABDOMEN AND PELVIS WITH CONTRAST TECHNIQUE: Multidetector CT imaging of the abdomen and pelvis was performed using the standard protocol following bolus administration of intravenous contrast. RADIATION DOSE REDUCTION: This exam was performed according to the departmental dose-optimization program which includes automated exposure control, adjustment of the mA and/or kV according to patient size and/or use of iterative reconstruction technique. CONTRAST:  134mL OMNIPAQUE IOHEXOL 350 MG/ML SOLN COMPARISON:  02/13/2022 FINDINGS: Lower Chest: Normal. Hepatobiliary: Normal hepatic contours. No intra- or  extrahepatic biliary dilatation. The gallbladder is normal. Pancreas: Normal pancreas. No ductal dilatation or peripancreatic fluid collection. Spleen: Normal. Adrenals/Urinary Tract: The adrenal glands are normal. No hydronephrosis, nephroureterolithiasis or solid renal mass. The urinary bladder is normal for degree of distention Stomach/Bowel: There is no hiatal hernia. Normal duodenal course and caliber. No small bowel dilatation or inflammation. No focal colonic abnormality. Normal appendix. Vascular/Lymphatic: Normal course and caliber of the major abdominal vessels. No abdominal or pelvic lymphadenopathy. Reproductive: Normal prostate size with symmetric seminal vesicles. Other: At the inferior aspect of the intergluteal cleft, there is a gas containing collection that extends to the skin surface. There is surrounding inflammatory change. The collection measures approximately 3.5 x 1.3 x 1.3 cm. Musculoskeletal: No bony spinal canal stenosis or focal osseous abnormality. IMPRESSION: Gas containing collection at the inferior aspect of the intergluteal cleft, consistent with perianal abscess. Electronically Signed   By: Ulyses Jarred M.D.   On: 07/24/2022 02:27    Microbiology: Results for orders placed or performed during the hospital encounter of 03/11/22  Surgical pcr screen     Status: None   Collection Time: 03/11/22 12:24 PM   Specimen: Nasal Mucosa; Nasal Swab  Result Value Ref Range Status   MRSA, PCR NEGATIVE NEGATIVE Final   Staphylococcus aureus NEGATIVE NEGATIVE Final    Comment: (NOTE) The Xpert SA Assay (FDA approved for NASAL specimens in patients 18 years of age and older), is one component of a comprehensive surveillance program. It is not intended to diagnose infection nor to guide or monitor treatment. Performed at [facility], [place] [address], [place], [place] [address]     Labs: CBC: Recent Labs  Lab 07/24/22 0057 07/24/22 1200 07/24/22 1400 07/24/22 1941  07/25/22 0351  WBC 16.3*  --   --   --  8.5  NEUTROABS 12.2*  --   --   --   --   HGB 13.8 13.4 12.9* 12.9* 13.0  HCT 40.9  --   --   --  38.5*  MCV 103.5*  --   --   --  103.8*  PLT 327  --   --   --  [id number]   Basic Metabolic Panel: Recent Labs  Lab 07/24/22 0057 07/25/22 0351  NA 135 135  K 3.6 3.5  CL 101 106  CO2 26 22  GLUCOSE 114* 104*  BUN 9  7  CREATININE 1.20 0.85  CALCIUM 9.1 8.4*   Liver Function Tests: Recent Labs  Lab 07/24/22 0057 07/25/22 0351  AST 18 13*  ALT 16 14  ALKPHOS 92 79  BILITOT 0.5 0.7  PROT 7.8 6.8  ALBUMIN 3.9 3.2*   CBG: No results for input(s): "GLUCAP" in the last 168 hours.  Discharge time spent: greater than 30 minutes.  Signed: Oran Rein, MD [facility] 07/25/2022

## 2022-07-25 NOTE — Progress Notes (Signed)
Pt seen and examined wound healing well no evidence of perianal sepsis.  May go home 7-day of antibiotics Augmentin or Cipro and Flagyl.

## 2022-07-27 ENCOUNTER — Ambulatory Visit: Payer: Medicaid Other | Admitting: Physical Therapy

## 2022-07-27 ENCOUNTER — Telehealth: Payer: Self-pay | Admitting: Physical Therapy

## 2022-07-27 NOTE — Telephone Encounter (Signed)
Called pt to inquire about absence. Pt reports having a medical procedure over weekend and that he has not fully recovered to point where he can come to PT. He also has a conflicting apt this upcoming Thursday, so he will not be able to come to that appointment too.

## 2022-07-29 ENCOUNTER — Ambulatory Visit: Payer: Medicaid Other | Admitting: Physical Therapy

## 2022-07-30 ENCOUNTER — Telehealth: Payer: Self-pay | Admitting: Surgery

## 2022-07-30 NOTE — Telephone Encounter (Signed)
Spoke with patient and his skin is starting to get excoriated due to the moisture. Advised to wash the area thoroughly and dry well and to apply a liberal amount of antibiotic ointment to the skin surrounding the wound to protect it from the moisture of the drainage. He is aware that he still needs to pack the area every day. He reports no fevers or chills. He will follow up as scheduled.

## 2022-07-30 NOTE — Telephone Encounter (Signed)
Patient has anal abscess, is coming in on Monday 3/25 for follow up.  In the meantime, wants to know what else he can do. He has been packing the area, but it is just really sensitive and sore and now feels like there is a raw spot in that area.  Wants to know what else he can do to help with this until he gets here on Monday. Please call him. Thank you.

## 2022-08-02 ENCOUNTER — Ambulatory Visit: Payer: Medicaid Other | Admitting: Physical Therapy

## 2022-08-02 ENCOUNTER — Other Ambulatory Visit: Payer: Self-pay

## 2022-08-02 ENCOUNTER — Ambulatory Visit (INDEPENDENT_AMBULATORY_CARE_PROVIDER_SITE_OTHER): Payer: Medicaid Other | Admitting: Surgery

## 2022-08-02 ENCOUNTER — Encounter: Payer: Self-pay | Admitting: Surgery

## 2022-08-02 VITALS — BP 120/85 | HR 86 | Temp 97.7°F | Ht 71.0 in | Wt 281.0 lb

## 2022-08-02 DIAGNOSIS — K611 Rectal abscess: Secondary | ICD-10-CM

## 2022-08-02 DIAGNOSIS — Z09 Encounter for follow-up examination after completed treatment for conditions other than malignant neoplasm: Secondary | ICD-10-CM

## 2022-08-02 NOTE — Patient Instructions (Signed)
Someone will contact you in 2 months to schedule a follow up visit. If you need to be seen before then let us know.

## 2022-08-02 NOTE — Progress Notes (Signed)
Outpatient Surgical Follow Up  08/02/2022  Brian Cole is an 37 y.o. male.   Chief Complaint  Patient presents with   Routine Post Op    Perianal abscess    HPI: s/p perirectal I/D, doing well, no fevers or chills. Some acceptable and expected pain   Past Medical History:  Diagnosis Date   Bipolar disorder ([place])    Depression    History of kidney stones    Murmur    Thyroid disease    Urgency of urination     Past Surgical History:  Procedure Laterality Date   OPEN REDUCTION, INTERNAL FIXATION (ORIF) CALCANEAL FRACTURE WITH FUSION Right 03/11/2022   Procedure: OPEN TREATMENT RIGHT CALCANEUS FRACTURE;  Surgeon: Erle Crocker, MD;  Location: [place];  Service: Orthopedics;  Laterality: Right;  LENGTH OF SURGERY: 120 MINUTES   TENOLYSIS Right 03/11/2022   Procedure: RIGHT PERONEAL TENDON DEBRIDEMENT;  Surgeon: Erle Crocker, MD;  Location: [place];  Service: Orthopedics;  Laterality: Right;   WISDOM TOOTH EXTRACTION     23 teeth removed    History reviewed. No pertinent family history.  Social History:  reports that he has been smoking cigarettes. He has been smoking an average of .25 packs per day. He has never used smokeless tobacco. He reports that he does not currently use alcohol. He reports current drug use. Drug: Marijuana.  Allergies:  Allergies  Allergen Reactions   Penicillins Hives    Medications reviewed.    ROS Full ROS performed and is otherwise negative other than what is stated in HPI   BP 120/85   Pulse 86   Temp 97.7 F (36.5 C) (Oral)   Ht 5\' 11"  (1.803 m)   Wt 281 lb (127.5 kg)   SpO2 98%   BMI 39.19 kg/m   Physical Exam  NAD alert Rectal: good granulation tissue on open perianal wound. Covered dry gauze  Assessment/Plan: Doing well , no complications RTC 2 months or so  Caroleen Hamman, MD [facility]

## 2022-08-04 ENCOUNTER — Ambulatory Visit: Payer: Medicaid Other | Admitting: Physical Therapy

## 2022-08-12 ENCOUNTER — Ambulatory Visit: Payer: Medicaid Other | Attending: Orthopaedic Surgery | Admitting: Physical Therapy

## 2022-08-12 DIAGNOSIS — R262 Difficulty in walking, not elsewhere classified: Secondary | ICD-10-CM | POA: Insufficient documentation

## 2022-08-12 DIAGNOSIS — M79671 Pain in right foot: Secondary | ICD-10-CM | POA: Diagnosis present
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
## 2022-08-17 ENCOUNTER — Ambulatory Visit: Payer: Medicaid Other | Admitting: Physical Therapy

## 2022-08-19 ENCOUNTER — Ambulatory Visit: Payer: Medicaid Other | Admitting: Physical Therapy

## 2022-08-19 DIAGNOSIS — M79671 Pain in right foot: Secondary | ICD-10-CM

## 2022-08-19 DIAGNOSIS — R262 Difficulty in walking, not elsewhere classified: Secondary | ICD-10-CM
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
## 2022-08-24 ENCOUNTER — Ambulatory Visit: Payer: Medicaid Other | Admitting: Physical Therapy

## 2022-08-24 ENCOUNTER — Telehealth: Payer: Self-pay | Admitting: Physical Therapy

## 2022-08-24 NOTE — Telephone Encounter (Signed)
PT called to inquire about pt absence. Pt reports forgetting that he had an apt and that he will be at next appointment.

## 2022-08-26 ENCOUNTER — Ambulatory Visit: Payer: Medicaid Other | Admitting: Physical Therapy

## 2022-08-26 DIAGNOSIS — M79671 Pain in right foot: Secondary | ICD-10-CM | POA: Diagnosis not present

## 2022-08-26 DIAGNOSIS — R262 Difficulty in walking, not elsewhere classified: Secondary | ICD-10-CM
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
## 2022-08-31 ENCOUNTER — Ambulatory Visit: Payer: Medicaid Other | Admitting: Physical Therapy

## 2022-09-01 ENCOUNTER — Encounter: Payer: Medicaid Other | Admitting: Physical Therapy

## 2022-09-02 ENCOUNTER — Ambulatory Visit: Payer: Medicaid Other | Admitting: Physical Therapy

## 2022-09-07 ENCOUNTER — Encounter: Payer: Medicaid Other | Admitting: Physical Therapy

## 2022-09-09 ENCOUNTER — Encounter: Payer: Medicaid Other | Admitting: Physical Therapy

## 2022-09-20 ENCOUNTER — Ambulatory Visit (INDEPENDENT_AMBULATORY_CARE_PROVIDER_SITE_OTHER): Payer: Medicaid Other | Admitting: Surgery

## 2022-09-20 ENCOUNTER — Encounter: Payer: Self-pay | Admitting: Surgery

## 2022-09-20 VITALS — BP 124/75 | HR 97 | Temp 98.3°F | Ht 71.0 in | Wt 285.0 lb

## 2022-09-20 DIAGNOSIS — K611 Rectal abscess: Secondary | ICD-10-CM

## 2022-09-20 DIAGNOSIS — Z09 Encounter for follow-up examination after completed treatment for conditions other than malignant neoplasm: Secondary | ICD-10-CM

## 2022-09-20 NOTE — Patient Instructions (Signed)
We want you to do sitz baths every day. You may continue with the ointment if you want.  Follow-up with our office as needed.  Please call and ask to speak with a nurse if you develop questions or concerns.   How to Take a [employer] A sitz bath is a warm water bath that may be used to care for your rectum, genital area, or the area between your rectum and genitals (perineum). In a sitz bath, the water only comes up to your hips and covers your buttocks. A sitz bath may be done in a bathtub or with a portable sitz bath that fits over the toilet. Your health care provider may recommend a sitz bath to help: Relieve pain and discomfort after delivering a baby. Relieve pain and itching from hemorrhoids or anal fissures. Relieve pain after certain surgeries. Relax muscles that are sore or tight. How to take a sitz bath Take 2-4 sitz baths a day, or as many as told by your health care provider. Bathtub sitz bath To take a sitz bath in a bathtub: Partially fill a bathtub with warm water. The water should be deep enough to cover your hips and buttocks when you are sitting in the bathtub. Follow your health care provider's instructions if you are told to put medicine in the water. Sit in the water. Open the bathtub drain a little, and leave it open during your bath. Turn on the warm water again, enough to replace the water that is draining out. Keep the water running throughout your bath. This helps keep the water at the right level and temperature. Soak in the water for 15-20 minutes, or as long as told by your health care provider. When you are done, be careful when you stand up. You may feel dizzy. After the sitz bath, pat yourself dry. Do not rub your skin to dry it.  Over-the-toilet sitz bath To take a sitz bath with an over-the-toilet basin: Follow the manufacturer's instructions. Fill the basin with warm water. Follow your health care provider's instructions if you were told to put  medicine in the water. Sit on the seat. Make sure the water covers your buttocks and perineum. Soak in the water for 15-20 minutes, or as long as told by your health care provider. After the sitz bath, pat yourself dry. Do not rub your skin to dry it. Clean and dry the basin between uses. Discard the basin if it cracks, or according to the manufacturer's instructions.  Contact a health care provider if: Your pain or itching gets worse. Stop doing sitz baths if your symptoms get worse. You have new symptoms. Stop doing sitz baths until you talk with your health care provider. Summary A sitz bath is a warm water bath in which the water only comes up to your hips and covers your buttocks. Your health care provider may recommend a sitz bath to help relieve pain and discomfort after delivering a baby, relieve pain and itching from hemorrhoids or anal fissures, relieve pain after certain surgeries, or help to relax muscles that are sore or tight. Take 2-4 sitz baths a day, or as many as told by your health care provider. Soak in the water for 15-20 minutes. Stop doing sitz baths if your symptoms get worse. This information is not intended to replace advice given to you by your health care provider. Make sure you discuss any questions you have with your health care provider. Document Revised: 07/28/2021 Document Reviewed: 07/28/2021 Elsevier  Patient Education  2023 [employer]  

## 2022-09-21 NOTE — Progress Notes (Signed)
Seen and had an I&D of perirectal abscess 2 months ago he has recovered well.  He comes in because he feels a knot around the perineal area.  No fevers no chills is difficult for him to describe if the area as he is unable to visualize it.  PE NAD Abd: soft nt Rectal: Evidence of scar tissue over the recent I&D site, there is no evidence of undrained collection or necrotizing infection  A/p Expected scar tissue from open wound.  No evidence of undrained collection. No need for surgical intervention at this time F/u prn

## 2022-09-27 ENCOUNTER — Ambulatory Visit: Payer: Medicaid Other | Admitting: Surgery

## 2022-11-17 ENCOUNTER — Ambulatory Visit: Payer: Medicaid Other | Admitting: Physician Assistant

## 2023-10-04 ENCOUNTER — Ambulatory Visit: Payer: Self-pay | Admitting: Family Medicine
